# Patient Record
Sex: Female | Born: 1990
Health system: Southern US, Community
[De-identification: ages and names within clinical notes are randomized; demographics above are authoritative.]

## PROBLEM LIST (undated history)

## (undated) ENCOUNTER — Ambulatory Visit

## (undated) DIAGNOSIS — T7840XA Allergy, unspecified, initial encounter: Secondary | ICD-10-CM

## (undated) DIAGNOSIS — I10 Essential (primary) hypertension: Secondary | ICD-10-CM

## (undated) HISTORY — DX: Essential (primary) hypertension: I10

## (undated) HISTORY — DX: Allergy, unspecified, initial encounter: T78.40XA

---
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
## 2011-10-25 ENCOUNTER — Ambulatory Visit (INDEPENDENT_AMBULATORY_CARE_PROVIDER_SITE_OTHER): Payer: BC Managed Care – PPO

## 2011-10-25 DIAGNOSIS — R070 Pain in throat: Secondary | ICD-10-CM

## 2011-10-25 DIAGNOSIS — J069 Acute upper respiratory infection, unspecified: Secondary | ICD-10-CM

## 2012-08-25 ENCOUNTER — Ambulatory Visit (INDEPENDENT_AMBULATORY_CARE_PROVIDER_SITE_OTHER): Payer: BC Managed Care – PPO | Admitting: Family Medicine

## 2012-08-25 VITALS — BP 116/64 | HR 109 | Temp 98.1°F | Resp 16 | Ht 67.0 in | Wt 119.0 lb

## 2012-08-25 DIAGNOSIS — N898 Other specified noninflammatory disorders of vagina: Secondary | ICD-10-CM

## 2012-08-25 LAB — POCT WET PREP WITH KOH
KOH Prep POC: NEGATIVE
RBC Wet Prep HPF POC: NEGATIVE
Trichomonas, UA: NEGATIVE
Yeast Wet Prep HPF POC: NEGATIVE

## 2012-08-25 MED ORDER — FLUCONAZOLE 150 MG PO TABS: 150.0000 mg | ORAL_TABLET | Freq: Once | ORAL | Status: DC

## 2012-08-25 MED ORDER — METRONIDAZOLE 500 MG PO TABS: 500.0000 mg | ORAL_TABLET | Freq: Two times a day (BID) | ORAL | Status: DC

## 2012-08-25 NOTE — Progress Notes (Signed)
[facility] [address] [place] [address] [phone]  Date:  08/25/2012   Name:  Hannah Weber   DOB:  January 10, 1991   MRN:  [id number]  PCP:  Sheila Oats, MD    Chief Complaint: Vaginal Discharge   History of Present Illness:  Hannah Weber is a 21 y.o. very pleasant female patient who presents with the following:  She is here today to evaluate vaginal discharge- she notes a thin, white discharge with itching.  No recent abx, no new sexual partners.  She would like to be tested for yeast/ BV today Her LMP was about 3 weeks ago.   No urinary symptoms. No abdominal pain or nausea/ vomiting/ diarrhea  There is no problem list on file for this patient.   History reviewed. No pertinent past medical history.  History reviewed. No pertinent past surgical history.  History  Substance Use Topics  . Smoking status: Current Every Day Smoker  . Smokeless tobacco: Not on file  . Alcohol Use: No    Family History  Problem Relation Age of Onset  . Diabetes Mother     No Known Allergies  Medication list has been reviewed and updated.  No current outpatient prescriptions on file prior to visit.    Review of Systems:  As per HPI- otherwise negative.   Physical Examination: Filed Vitals:   08/25/12 1012  BP: 116/64  Pulse: 109  Temp: 98.1 F (36.7 C)  Resp: 16   Filed Vitals:   08/25/12 1012  Height: 5\' 7"  (1.702 m)  Weight: 119 lb (53.978 kg)   Body mass index is 18.64 kg/(m^2). Ideal Body Weight: Weight in (lb) to have BMI = 25: 159.3   GEN: WDWN, NAD, Non-toxic, A & O x 3 HEENT: Atraumatic, Normocephalic. Neck supple. No masses, No LAD. Ears and Nose: No external deformity. CV: RRR, No M/G/R. No JVD. No thrill. No extra heart sounds. PULM: CTA B, no wheezes, crackles, rhonchi. No retractions. No resp. distress. No accessory muscle use. ABD: S, NT, ND, +BS. No rebound. No HSM. EXTR: No c/c/e NEURO Normal gait.  PSYCH:  Normally interactive. Conversant. Not depressed or anxious appearing.  Calm demeanor.  GU: small vaginal canal, not able to tolerate speculum exam.  Performed WP swab and gently performed BME.  No CMT or uterine/ adnexal tenderness or masses.  Vaginal canal shows slight inflammation- suspect yeast or BV  Results for orders placed in visit on 08/25/12  POCT WET PREP WITH KOH      Component Value Range   Trichomonas, UA Negative     Clue Cells Wet Prep HPF POC 2-4     Epithelial Wet Prep HPF POC 1-3     Yeast Wet Prep HPF POC negative     Bacteria Wet Prep HPF POC 2+     RBC Wet Prep HPF POC negative     WBC Wet Prep HPF POC 10-15     KOH Prep POC Negative       Assessment and Plan: 1. Vaginal discharge  POCT Wet Prep with KOH, metroNIDAZOLE (FLAGYL) 500 MG tablet, fluconazole (DIFLUCAN) 150 MG tablet   Treat as above for vaginitis. If not better will RTC or call- at that time may need a ureaprobe.  She will let me know sooner if worse  Molli Gethers, MD

## 2012-08-25 NOTE — Patient Instructions (Addendum)
Please be sure to call me if you are not better in the next week or so- Sooner if worse.

## 2012-08-28 ENCOUNTER — Ambulatory Visit (INDEPENDENT_AMBULATORY_CARE_PROVIDER_SITE_OTHER): Payer: BC Managed Care – PPO | Admitting: Family Medicine

## 2012-08-28 ENCOUNTER — Telehealth: Payer: Self-pay

## 2012-08-28 VITALS — BP 114/62 | HR 81 | Temp 98.3°F | Resp 18 | Ht 68.0 in | Wt 115.0 lb

## 2012-08-28 DIAGNOSIS — N898 Other specified noninflammatory disorders of vagina: Secondary | ICD-10-CM

## 2012-08-28 DIAGNOSIS — N760 Acute vaginitis: Secondary | ICD-10-CM

## 2012-08-28 DIAGNOSIS — B373 Candidiasis of vulva and vagina: Secondary | ICD-10-CM

## 2012-08-28 DIAGNOSIS — R300 Dysuria: Secondary | ICD-10-CM

## 2012-08-28 DIAGNOSIS — L309 Dermatitis, unspecified: Secondary | ICD-10-CM

## 2012-08-28 LAB — POCT WET PREP WITH KOH
KOH Prep POC: POSITIVE
Trichomonas, UA: NEGATIVE
Yeast Wet Prep HPF POC: POSITIVE

## 2012-08-28 LAB — POCT URINALYSIS DIPSTICK
Glucose, UA: NEGATIVE
Nitrite, UA: NEGATIVE
Protein, UA: 30
Urobilinogen, UA: 0.2

## 2012-08-28 LAB — POCT UA - MICROSCOPIC ONLY: Crystals, Ur, HPF, POC: NEGATIVE

## 2012-08-28 MED ORDER — TRIAMCINOLONE ACETONIDE 0.1 % EX CREA: TOPICAL_CREAM | Freq: Two times a day (BID) | CUTANEOUS | Status: DC

## 2012-08-28 MED ORDER — METRONIDAZOLE 0.75 % VA GEL: 1.0000 | Freq: Every day | VAGINAL | Status: AC

## 2012-08-28 MED ORDER — FLUCONAZOLE 150 MG PO TABS: 150.0000 mg | ORAL_TABLET | Freq: Once | ORAL | Status: DC

## 2012-08-28 MED ORDER — SULFAMETHOXAZOLE-TRIMETHOPRIM 800-160 MG PO TABS: 1.0000 | ORAL_TABLET | Freq: Two times a day (BID) | ORAL | Status: DC

## 2012-08-28 NOTE — Patient Instructions (Addendum)
Start the twice a day bactrim antibiotic for the hair and pore infection and to cover any potential urine infection.  Use the metronidazole gel every night - 1 applicatorful into the vagina for 5 night.  Take a yeast pill (diflucan) tonight, repeat in 1 wk and repeat 3d after that. If you are still having symptoms in 2 wks, come back to see me.  If you get worse, come back sooner.

## 2012-08-28 NOTE — Progress Notes (Signed)
Subjective:    Patient ID: Hannah Weber, female    DOB: Dec 27, 1990, 21 y.o.   MRN: [id number] Chief Complaint  Patient presents with  . Vaginal Discharge    no improvement still itching  . Other    urine discoloration   . Hemorrhoids    noticed cut     HPI  Urine dark x 3d.  Has hesitancy and frequency and urgency. Has had the vaginal discharge for about a wk and loosk like white cream.  Did try otc monistat once sev days before she was seen here. Her wet prep/KOH was negative but due to her sxs she was treated emperically with diflucan and flagyl which she is taking still.  Still having some burning around vaginal opening and razor bump problems - bumps on inner labia and mons.  She notices a lot of thick white discharge under clitoral hood. Also had a painful stool with a tiny bit of blood on the outside so would like to be checked for hemorrhoids.  Pt has is sexually active with women only - she does not practice penetration at all (or ever) so her introitus is very small and internal vaginal exams are exquisitely painful for her. No past medical history on file. No current outpatient prescriptions on file prior to visit.   Review of Systems  Constitutional: Negative for fever, chills, diaphoresis, activity change, appetite change, fatigue and unexpected weight change.  Gastrointestinal: Positive for blood in stool and rectal pain. Negative for abdominal pain, diarrhea, constipation and anal bleeding.  Genitourinary: Positive for urgency, frequency, hematuria, decreased urine volume, vaginal discharge, genital sores and vaginal pain. Negative for dysuria, flank pain, vaginal bleeding, enuresis, difficulty urinating, menstrual problem, pelvic pain and dyspareunia.  Musculoskeletal: Negative for gait problem.  Skin: Positive for rash.  Hematological: Negative for adenopathy.  Psychiatric/Behavioral: The patient is nervous/anxious.       BP 114/62  Pulse 81  Temp 98.3 F (36.8 C)  (Oral)  Resp 18  Ht 5\' 8"  (1.727 m)  Wt 115 lb (52.164 kg)  BMI 17.49 kg/m2  SpO2 99%  LMP 07/28/2012 Objective:   Physical Exam  Constitutional: She is oriented to person, place, and time. She appears well-developed and well-nourished. No distress.  HENT:  Head: Normocephalic and atraumatic.  Pulmonary/Chest: Effort normal.  Genitourinary: Rectal exam shows external hemorrhoid. Rectal exam shows no internal hemorrhoid, no fissure, no mass, no tenderness and anal tone normal. Pelvic exam was performed with patient supine. There is rash and tenderness on the right labia. There is rash and tenderness on the left labia. Cervix exhibits no motion tenderness. There is tenderness around the vagina. No erythema or bleeding around the vagina. No foreign body around the vagina. No signs of injury around the vagina. Vaginal discharge found.       Did not do speculum exam, bimanual exam with 1 finger done only and still very painful for pt.  In pubic hair and external labia has small skin colored papules w/ white scale/crust Thin white discharge seen. 1 non-inflammed soft external hemorrhoid at 12 o'clock  Lymphadenopathy:       Right: No inguinal adenopathy present.       Left: No inguinal adenopathy present.  Neurological: She is alert and oriented to person, place, and time.  Skin: Skin is warm and dry. Rash noted. Rash is maculopapular. She is not diaphoretic.          Dry scaling eczematous lesion  Psychiatric: She has a normal  mood and affect. Her behavior is normal.      Results for orders placed in visit on 08/28/12  POCT WET PREP WITH KOH      Component Value Range   Trichomonas, UA Negative     Clue Cells Wet Prep HPF POC 3-6     Epithelial Wet Prep HPF POC 8-16     Yeast Wet Prep HPF POC positive     Bacteria Wet Prep HPF POC 3+     RBC Wet Prep HPF POC 1-4     WBC Wet Prep HPF POC 3-8     KOH Prep POC Positive    POCT UA - MICROSCOPIC ONLY      Component Value Range   WBC,  Ur, HPF, POC 6-8     RBC, urine, microscopic 2-5     Bacteria, U Microscopic 3+     Mucus, UA trace     Epithelial cells, urine per micros 2-10     Crystals, Ur, HPF, POC neg     Casts, Ur, LPF, POC neg     Yeast, UA positive    POCT URINALYSIS DIPSTICK      Component Value Range   Color, UA brown     Clarity, UA cloudy     Glucose, UA neg     Bilirubin, UA small     Ketones, UA trace     Spec Grav, UA 1.020     Blood, UA neg     pH, UA 7.5     Protein, UA 30     Urobilinogen, UA 0.2     Nitrite, UA neg     Leukocytes, UA small (1+)      Assessment & Plan:   1. Dysuria  POCT Wet Prep with KOH, POCT UA - Microscopic Only, POCT urinalysis dipstick, Urine culture, GC/chlamydia probe amp, genital, sulfamethoxazole-trimethoprim (BACTRIM DS,SEPTRA DS) 800-160 MG per tablet  2. Vaginitis  POCT Wet Prep with KOH, POCT UA - Microscopic Only, POCT urinalysis dipstick, Urine culture, GC/chlamydia probe amp, genital, metroNIDAZOLE (METROGEL VAGINAL) 0.75 % vaginal gel  3. Vaginal candidiasis  fluconazole (DIFLUCAN) 150 MG tablet  4. Vaginal discharge  fluconazole (DIFLUCAN) 150 MG tablet  5. Eczema - start triamcinolone  Pt has yeast infection and folliculitis.  It seems like her initial sxs were from the yeast infection so I suspect that has been her underlying concern. Her urine dip looks suprisingly good for brown cloudy urine with her sxs.  Will treat her folliculitis w/ Bactrim in hopes that that will cover any potential UTI - though UClx pending as well.  No BV seen on wet prep but as pt as started the flagyl will have her finish course - however, she is having a lot of trouble getting the large pills down so will switch her to metro-gel vag.   Take diflucan pill after she has finished all antibiotics and repeat again 3d later. RTC in 2 wks if sxs persist. Sooner if worsening.

## 2012-08-28 NOTE — Telephone Encounter (Signed)
She is coming back to office, she now has urinary symptoms.

## 2012-08-28 NOTE — Telephone Encounter (Signed)
PT STATES SHE WAS GIVEN SOME MEDICINE AND NOW HER URINE HAVE CHANGED COLORS AND SHE IS CONCERNED. WOULD LIKE TO KNOW IF SHE SHOULD BE PLEASE CALL [phone]

## 2012-08-30 ENCOUNTER — Telehealth: Payer: Self-pay

## 2012-08-30 LAB — URINE CULTURE: Colony Count: 40000

## 2012-08-30 LAB — GC/CHLAMYDIA PROBE AMP: GC Probe RNA: NEGATIVE

## 2012-08-30 NOTE — Telephone Encounter (Signed)
LMOM to CB. 

## 2012-08-30 NOTE — Telephone Encounter (Signed)
Pt is needing to talk with someone she feels her medications is not right   Best number [phone]

## 2012-08-31 NOTE — Telephone Encounter (Signed)
lmom to cb. 

## 2012-09-01 ENCOUNTER — Telehealth: Payer: Self-pay

## 2012-09-01 NOTE — Telephone Encounter (Signed)
lmom to cb. 

## 2012-09-01 NOTE — Telephone Encounter (Signed)
Patient was prescribed bactrim and would like something different prescribed instead.  Best [phone]  [facility]

## 2012-09-01 NOTE — Telephone Encounter (Signed)
Pt stated that it was a misunderstanding about medication and she was ok

## 2012-09-02 NOTE — Telephone Encounter (Signed)
lmom to cb  Get more info on why she wants to change.

## 2012-09-02 NOTE — Telephone Encounter (Signed)
If pt is not seeing improvement, she should RTC

## 2012-09-02 NOTE — Telephone Encounter (Signed)
Patient states she is still having trouble with folliculitis, she states UTI is better, would like to know if she should try something else for the folliculitis. Please advise.

## 2012-09-02 NOTE — Telephone Encounter (Signed)
Left message for her to advise. She is to call back if she has questions.

## 2012-09-03 ENCOUNTER — Ambulatory Visit (INDEPENDENT_AMBULATORY_CARE_PROVIDER_SITE_OTHER): Payer: BC Managed Care – PPO | Admitting: Family Medicine

## 2012-09-03 VITALS — BP 120/82 | HR 92 | Temp 98.7°F | Resp 16 | Ht 68.5 in | Wt 115.6 lb

## 2012-09-03 DIAGNOSIS — R21 Rash and other nonspecific skin eruption: Secondary | ICD-10-CM

## 2012-09-03 MED ORDER — DOXYCYCLINE HYCLATE 100 MG PO TABS: 100.0000 mg | ORAL_TABLET | Freq: Two times a day (BID) | ORAL | Status: DC

## 2012-09-03 MED ORDER — MUPIROCIN CALCIUM 2 % EX CREA: TOPICAL_CREAM | Freq: Three times a day (TID) | CUTANEOUS | Status: DC

## 2012-09-03 NOTE — Progress Notes (Signed)
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 The urinary symptoms of cleared as has a yeast infection. Nevertheless the vulva continues be red scaly and irritated (having discomfort when wearing underwear). She's given triamcinolone along with the Septra and Diflucan on 11/27. Patient only has same sex partners  Objective: Patient has a thickened, scaly skin rash on the vulva and lower pubic area. There no open sores, no papules and no lesions suggestive of HPV.  Assessment: Mild cellulitis  Plan: Doxycycline and Bactroban 1. Rash  doxycycline (VIBRA-TABS) 100 MG tablet, mupirocin cream (BACTROBAN) 2 %

## 2014-11-26 ENCOUNTER — Ambulatory Visit (INDEPENDENT_AMBULATORY_CARE_PROVIDER_SITE_OTHER): Payer: BC Managed Care – PPO | Admitting: Physician Assistant

## 2014-11-26 VITALS — BP 142/80 | HR 106 | Temp 97.9°F | Resp 20 | Ht 67.25 in | Wt 133.2 lb

## 2014-11-26 DIAGNOSIS — L02214 Cutaneous abscess of groin: Secondary | ICD-10-CM

## 2014-11-26 MED ORDER — DOXYCYCLINE HYCLATE 100 MG PO CAPS: 100.0000 mg | ORAL_CAPSULE | Freq: Two times a day (BID) | ORAL | Status: DC

## 2014-11-26 MED ORDER — IBUPROFEN 600 MG PO TABS: 600.0000 mg | ORAL_TABLET | Freq: Three times a day (TID) | ORAL | Status: DC | PRN

## 2014-11-26 NOTE — Patient Instructions (Signed)
Incision and Drainage Care After Refer to this sheet in the next few weeks. These instructions provide you with information on caring for yourself after your procedure. Your caregiver may also give you more specific instructions. Your treatment has been planned according to current medical practices, but problems sometimes occur. Call your caregiver if you have any problems or questions after your procedure. HOME CARE INSTRUCTIONS   If antibiotic medicine is given, take it as directed. Finish it even if you start to feel better.  Only take over-the-counter or prescription medicines for pain, discomfort, or fever as directed by your caregiver.  Keep all follow-up appointments as directed by your caregiver.  Change any bandages (dressings) as directed by your caregiver. Replace old dressings with clean dressings.  Wash your hands before and after caring for your wound. You will receive specific instructions for cleansing and caring for your wound.  SEEK MEDICAL CARE IF:   You have increased pain, swelling, or redness around the wound.  You have increased drainage, smell, or bleeding from the wound.  You have muscle aches, chills, or you feel generally sick.  You have a fever. MAKE SURE YOU:   Understand these instructions.  Will watch your condition.  Will get help right away if you are not doing well or get worse. Document Released: 12/11/2011 Document Reviewed: 12/11/2011 ExitCare Patient Information 2015 [employer]. This information is not intended to replace advice given to you by your health care provider. Make sure you discuss any questions you have with your health care provider.  

## 2014-11-26 NOTE — Progress Notes (Signed)
   Subjective:    Patient ID: Hannah Weber, female    DOB: April 20, 1991, 24 y.o.   MRN: [id number]  HPI Patient presents for boil that has been on right leg 5 days. Initially looked like an ingrown hair, but grew in size and became more painful over past 4 days. Is red and swollen. Has never had this before. Denies fever. NKDA.    Review of Systems  Constitutional: Negative for fever and chills.  Skin: Negative for pallor, rash and wound.  Hematological: Negative for adenopathy.       Objective:   Physical Exam  Constitutional: She is oriented to person, place, and time. She appears well-developed and well-nourished. No distress.  Blood pressure 142/80, pulse 106, temperature 97.9 F (36.6 C), temperature source Oral, resp. rate 20, height 5' 7.25" (1.708 m), weight 133 lb 4 oz (60.442 kg), last menstrual period 11/02/2014, SpO2 100 %.  HENT:  Head: Normocephalic and atraumatic.  Right Ear: External ear normal.  Left Ear: External ear normal.  Neurological: She is alert and oriented to person, place, and time.  Skin: Skin is warm and dry. No rash noted. She is not diaphoretic. There is erythema. No pallor.  3-31/2 cm abscess that is erythematous located in right groin.   Procedure Consent obtained. Local anesthesia 2 cc 1% lido. 1 1/2 cm incision made. Culture obtained. Purulence expressed. Wound explored and sebaceous material removed. Irrigated with 3 cc lido. 1/4 plain packing placed. Clean dressing placed.      Assessment & Plan:  1. Abscess of groin, right Wound care instructions given. RTC 11/28/14 for wound check.  - doxycycline (VIBRAMYCIN) 100 MG capsule; Take 1 capsule (100 mg total) by mouth 2 (two) times daily.  Dispense: 20 capsule; Refill: 0 - ibuprofen (ADVIL,MOTRIN) 600 MG tablet; Take 1 tablet (600 mg total) by mouth every 8 (eight) hours as needed.  Dispense: 30 tablet; Refill: 0 - Wound culture   Alveta Heimlich PA-C  [facility] 11/26/2014 7:53 PM

## 2014-11-28 ENCOUNTER — Ambulatory Visit (INDEPENDENT_AMBULATORY_CARE_PROVIDER_SITE_OTHER): Payer: BC Managed Care – PPO | Admitting: Family Medicine

## 2014-11-28 VITALS — BP 124/66 | HR 88 | Temp 98.2°F | Resp 18 | Ht 68.0 in | Wt 136.0 lb

## 2014-11-28 DIAGNOSIS — L02214 Cutaneous abscess of groin: Secondary | ICD-10-CM

## 2014-11-28 NOTE — Progress Notes (Signed)
° °  Subjective:    Patient ID: Hannah Weber, female    DOB: [birth year], 24 y.o.   MRN: [id number] This chart was scribed for Robyn Haber, MD by Zola Button, Medical Scribe. This patient was seen in Room 10 and the patient's care was started at 8:58 AM.   HPI HPI Comments: Hannah Weber is a 24 y.o. female who presents to the [facility] for a wound check. Patient has a boil on her groin on the right side that was drained 2 days here by [employer], PA-C. She states that she had had the boil for 5 days before it was lanced. She has been taking the antibiotics as prescribed. Patient denies fever.    Review of Systems  Constitutional: Negative for fever.  Skin: Positive for wound.       Objective:   Physical Exam CONSTITUTIONAL: Well developed/well nourished HEAD: Normocephalic/atraumatic EYES: EOM/PERRL ENMT: Mucous membranes moist NECK: supple no meningeal signs SPINE: entire spine nontender CV: S1/S2 noted, no murmurs/rubs/gallops noted LUNGS: Lungs are clear to auscultation bilaterally, no apparent distress ABDOMEN: soft, nontender, no rebound or guarding GU: no cva tenderness NEURO: Pt is awake/alert, moves all extremitiesx4 EXTREMITIES: pulses normal, full ROM SKIN: warm, color normal PSYCH: no abnormalities of mood noted  Right groin abscess shows small amount of sebaceous material at the base which was expressed after the surgical gauze was removed. There is no significant induration or erythema in the surrounding area.      Assessment & Plan:  Sebaceous abscess, healing  This chart was scribed in my presence and reviewed by me personally.    ICD-9-CM ICD-10-CM   1. Abscess of groin, right 682.2 O96.295      Signed, Robyn Haber, MD

## 2014-11-29 LAB — WOUND CULTURE

## 2014-11-30 ENCOUNTER — Telehealth: Payer: Self-pay | Admitting: Physician Assistant

## 2014-11-30 NOTE — Telephone Encounter (Signed)
Spoke with patient about lab results. States that she was here on Saturday and wound was not repacked and she is a little concerned as it is feeling hard in one area. Advised to come back in and will re-evaluate.

## 2015-02-04 ENCOUNTER — Telehealth: Payer: Self-pay

## 2015-02-04 ENCOUNTER — Other Ambulatory Visit: Payer: Self-pay | Admitting: Radiology

## 2015-02-04 NOTE — Telephone Encounter (Signed)
Patient is returning missed phone call. Please call back!

## 2015-02-04 NOTE — Telephone Encounter (Signed)
Pt was seen on 2/25 for an abscess on her groin & then she had a follow up on 2/27 for the same thing. She is wanting a note for work-days she wants it for is unclear. Please advise at [phone]

## 2015-02-04 NOTE — Telephone Encounter (Signed)
Pt hasn't been seen since 2/27. Surely we can't write work note for 5/3? I called pt to clarify more but had to leave message.

## 2015-02-04 NOTE — Telephone Encounter (Signed)
Pt wants a work note for 5/3.

## 2015-02-04 NOTE — Telephone Encounter (Signed)
Will write note

## 2015-02-04 NOTE — Telephone Encounter (Signed)
Hannah Weber for work note for 5/03.

## 2015-02-04 NOTE — Telephone Encounter (Signed)
[name] needs a note for work for the dates of 11/26/14 & 11/28/14. She missed work on those two days because of being ill she was seen here. Apparently a final was given to her on her job because she didn't have a doctor's note for those two days and she's needing proof now that she was here and did miss work on those two days. She was just approached about it from her job on 02/02/15. Please contact and let her know if she can get a note or not.

## 2015-02-04 NOTE — Telephone Encounter (Signed)
Note written for 2/25 and 2/27. Pt notified and will come pick up note.

## 2015-09-05 ENCOUNTER — Ambulatory Visit (INDEPENDENT_AMBULATORY_CARE_PROVIDER_SITE_OTHER): Payer: BC Managed Care – PPO | Admitting: Physician Assistant

## 2015-09-05 VITALS — BP 120/76 | HR 82 | Temp 98.6°F | Resp 18 | Ht 68.0 in | Wt 144.2 lb

## 2015-09-05 DIAGNOSIS — K611 Rectal abscess: Secondary | ICD-10-CM | POA: Diagnosis not present

## 2015-09-05 DIAGNOSIS — F17210 Nicotine dependence, cigarettes, uncomplicated: Secondary | ICD-10-CM | POA: Diagnosis not present

## 2015-09-05 DIAGNOSIS — F17200 Nicotine dependence, unspecified, uncomplicated: Secondary | ICD-10-CM

## 2015-09-05 MED ORDER — AMOXICILLIN-POT CLAVULANATE 875-125 MG PO TABS: 1.0000 | ORAL_TABLET | Freq: Two times a day (BID) | ORAL | Status: DC

## 2015-09-06 NOTE — Progress Notes (Signed)
   09/07/2015 8:55 AM   DOB: 01/30/1991 / MRN: [id number]  SUBJECTIVE:  Hannah Weber is a 24 y.o. female presenting for 3 days of right sided perirectal pain made worse with sitting. Describes the pain as mild to moderate a throbbing in nature.  No fever, chills, nausea, diarrhea or constipation.  She has tried Ibuprofen OTC with good relief.  Denies abnormal vaginal discharge.    She has No Known Allergies.   She  has no past medical history on file.    She  reports that she has been smoking.  She has never used smokeless tobacco. She reports that she drinks alcohol. She reports that she uses illicit drugs (Marijuana). She  reports that she does not engage in sexual activity. The patient  has no past surgical history on file.  Her family history includes Diabetes in her mother.  Review of Systems  Musculoskeletal: Negative for myalgias.  Skin: Positive for rash. Negative for itching.  Neurological: Negative for dizziness.    Problem list and medications reviewed and updated by myself where necessary, and exist elsewhere in the encounter.   OBJECTIVE:  BP 120/76 mmHg  Pulse 82  Temp(Src) 98.6 F (37 C) (Oral)  Resp 18  Ht 5\' 8"  (1.727 m)  Wt 144 lb 3.2 oz (65.409 kg)  BMI 21.93 kg/m2  SpO2 97%  LMP 09/02/2015 CrCl cannot be calculated (Patient has no serum creatinine result on file.).  Physical Exam  Constitutional: She is oriented to person, place, and time. She appears well-nourished. No distress.  Eyes: EOM are normal. Pupils are equal, round, and reactive to light.  Cardiovascular: Normal rate.   Pulmonary/Chest: Effort normal.  Abdominal: She exhibits no distension.  Neurological: She is alert and oriented to person, place, and time. No cranial nerve deficit. Gait normal.  Skin: Skin is dry. She is not diaphoretic.     Psychiatric: She has a normal mood and affect.  Vitals reviewed.   No results found for this or any previous visit (from the past 48  hour(s)).  ASSESSMENT AND PLAN  [name] was seen today for recurrent skin infections.  Diagnoses and all orders for this visit:  Perirectal abscess: vs. Abscess.  Will cover for perirectal and doubt staph.  Advised that she return if her symptoms worsen.   -     amoxicillin-clavulanate (AUGMENTIN) 875-125 MG tablet; Take 1 tablet by mouth 2 (two) times daily.    The patient was advised to call or return to clinic if she does not see an improvement in symptoms or to seek the care of the closest emergency department if she worsens with the above plan.   Philis Fendt, MHS, PA-C [facility] 09/07/2015 8:55 AM

## 2015-09-07 DIAGNOSIS — F17200 Nicotine dependence, unspecified, uncomplicated: Secondary | ICD-10-CM | POA: Insufficient documentation

## 2015-09-07 HISTORY — DX: Nicotine dependence, unspecified, uncomplicated: F17.200

## 2015-09-08 NOTE — Progress Notes (Signed)
  Medical screening examination/treatment/procedure(s) were performed by non-physician practitioner and as supervising physician I was immediately available for consultation/collaboration.     

## 2015-10-22 ENCOUNTER — Encounter: Payer: Self-pay | Admitting: Family Medicine

## 2015-10-27 ENCOUNTER — Encounter: Payer: Self-pay | Admitting: Family Medicine

## 2017-10-07 ENCOUNTER — Ambulatory Visit (HOSPITAL_COMMUNITY)
Admission: EM | Admit: 2017-10-07 | Discharge: 2017-10-07 | Disposition: A | Discharge status: Home / Self Care | Payer: Self-pay | Attending: Urgent Care | Admitting: Urgent Care

## 2017-10-07 ENCOUNTER — Encounter (HOSPITAL_COMMUNITY): Payer: Self-pay | Admitting: Emergency Medicine

## 2017-10-07 DIAGNOSIS — Z789 Other specified health status: Secondary | ICD-10-CM

## 2017-10-07 DIAGNOSIS — R1013 Epigastric pain: Secondary | ICD-10-CM

## 2017-10-07 DIAGNOSIS — Z7289 Other problems related to lifestyle: Secondary | ICD-10-CM

## 2017-10-07 DIAGNOSIS — R110 Nausea: Secondary | ICD-10-CM

## 2017-10-07 DIAGNOSIS — R5381 Other malaise: Secondary | ICD-10-CM

## 2017-10-07 DIAGNOSIS — R195 Other fecal abnormalities: Secondary | ICD-10-CM

## 2017-10-07 DIAGNOSIS — R112 Nausea with vomiting, unspecified: Secondary | ICD-10-CM

## 2017-10-07 LAB — POCT URINALYSIS DIP (DEVICE)
BILIRUBIN URINE: NEGATIVE
Glucose, UA: NEGATIVE mg/dL
KETONES UR: 80 mg/dL — AB
Leukocytes, UA: NEGATIVE
Nitrite: NEGATIVE
PH: 5.5 (ref 5.0–8.0)
PROTEIN: NEGATIVE mg/dL
Specific Gravity, Urine: 1.015 (ref 1.005–1.030)
Urobilinogen, UA: 0.2 mg/dL (ref 0.0–1.0)

## 2017-10-07 LAB — POCT I-STAT, CHEM 8
BUN: 10 mg/dL (ref 6–20)
CHLORIDE: 103 mmol/L (ref 101–111)
CREATININE: 0.8 mg/dL (ref 0.44–1.00)
Calcium, Ion: 1.2 mmol/L (ref 1.15–1.40)
GLUCOSE: 109 mg/dL — AB (ref 65–99)
HCT: 48 % — ABNORMAL HIGH (ref 36.0–46.0)
Hemoglobin: 16.3 g/dL — ABNORMAL HIGH (ref 12.0–15.0)
POTASSIUM: 4.1 mmol/L (ref 3.5–5.1)
Sodium: 140 mmol/L (ref 135–145)
TCO2: 24 mmol/L (ref 22–32)

## 2017-10-07 LAB — POCT H PYLORI SCREEN: H. PYLORI SCREEN, POC: NEGATIVE

## 2017-10-07 MED ORDER — ONDANSETRON 8 MG PO TBDP: 8.0000 mg | ORAL_TABLET | Freq: Three times a day (TID) | ORAL | 0 refills | Status: DC | PRN

## 2017-10-07 NOTE — ED Provider Notes (Signed)
MRN: [id number] DOB: 1991-07-13  Subjective:   Hannah Weber is a 27 y.o. female presenting for 3 day history of epigastric pain, nausea without vomiting, ~2 loose stools per day. Denies fever, dysuria, hematuria, blood stools, polydipsia. She is trying to eat but her belly starts hurting with food. She started her cycle 09/26/2017, stopped 10/03/2017 and was regular. Patient stopped drinking new year's day. Prior to that patient admits she was drinking at least 4 beers per day regularly. Has tried Pepto-Bismol last night without any significant changes. Admits that she has not been taking very good care of herself, she is not hydrating well. Has never had H. pylori infection.   [name] is not currently taking any medications and has No Known Allergies.  Earlean denies past medical and surgical history. Her family history includes Diabetes in her mother.   Objective:   Vitals: BP 134/73 (BP Location: Left Arm)   Pulse (!) 116   Temp 99 F (37.2 C) (Oral)   Resp 18   LMP 09/27/2017   SpO2 100%   Physical Exam  Constitutional: She is oriented to person, place, and time. She appears well-developed and well-nourished.  HENT:  Mouth/Throat: Oropharynx is clear and moist.  Cardiovascular: Normal rate, regular rhythm and intact distal pulses. Exam reveals no gallop and no friction rub.  No murmur heard. Pulmonary/Chest: No respiratory distress. She has no wheezes. She has no rales.  Abdominal: Soft. Bowel sounds are normal. She exhibits no distension and no mass. There is no tenderness. There is no guarding.  Neurological: She is alert and oriented to person, place, and time.  Skin: Skin is warm and dry.  Psychiatric: She has a normal mood and affect.   Results for orders placed or performed during the hospital encounter of 10/07/17 (from the past 24 hour(s))  POCT urinalysis dip (device)     Status: Abnormal   Collection Time: 10/07/17  8:27 PM  Result Value Ref Range   Glucose, UA  NEGATIVE NEGATIVE mg/dL   Bilirubin Urine NEGATIVE NEGATIVE   Ketones, ur 80 (A) NEGATIVE mg/dL   Specific Gravity, Urine 1.015 1.005 - 1.030   Hgb urine dipstick SMALL (A) NEGATIVE   pH 5.5 5.0 - 8.0   Protein, ur NEGATIVE NEGATIVE mg/dL   Urobilinogen, UA 0.2 0.0 - 1.0 mg/dL   Nitrite NEGATIVE NEGATIVE   Leukocytes, UA NEGATIVE NEGATIVE  H.pylori screen, POC     Status: None   Collection Time: 10/07/17  8:47 PM  Result Value Ref Range   H. PYLORI SCREEN, POC NEGATIVE NEGATIVE  I-STAT, chem 8     Status: Abnormal   Collection Time: 10/07/17  8:49 PM  Result Value Ref Range   Sodium 140 135 - 145 mmol/L   Potassium 4.1 3.5 - 5.1 mmol/L   Chloride 103 101 - 111 mmol/L   BUN 10 6 - 20 mg/dL   Creatinine, Ser 0.80 0.44 - 1.00 mg/dL   Glucose, Bld 109 (H) 65 - 99 mg/dL   Calcium, Ion 1.20 1.15 - 1.40 mmol/L   TCO2 24 22 - 32 mmol/L   Hemoglobin 16.3 (H) 12.0 - 15.0 g/dL   HCT 48.0 (H) 36.0 - 46.0 %   Assessment and Plan :   Abdominal pain, epigastric  Alcohol use  Nausea without vomiting  Loose stools  Malaise   Patient is to try supportive care, maintain no alcohol use. Recommended she follow up with her PCP for repeat cbc and further work up. Return-to-clinic precautions  discussed, patient verbalized understanding.    Jaynee Eagles, [place] 10/07/17 2112

## 2017-10-07 NOTE — Discharge Instructions (Addendum)
Please try to stay away from alcohol use and hydrate very well.

## 2017-10-07 NOTE — ED Triage Notes (Signed)
PT C/O: intermittent abd pain onset 3 days associated w/nauseas, diarrhea, deceased appetite  Hx of GERD  DENIES: vomiting, diarrhea, urinary sx  TAKING MEDS: Pepto bismol w/some relief  A&O x4... NAD... Ambulatory

## 2017-10-11 ENCOUNTER — Encounter (HOSPITAL_COMMUNITY): Payer: Self-pay

## 2017-10-11 DIAGNOSIS — F17200 Nicotine dependence, unspecified, uncomplicated: Secondary | ICD-10-CM | POA: Insufficient documentation

## 2017-10-11 DIAGNOSIS — F419 Anxiety disorder, unspecified: Secondary | ICD-10-CM | POA: Insufficient documentation

## 2017-10-11 NOTE — ED Triage Notes (Signed)
Pt smoked some marijuana this evening and then had a panic attack, EMS gave her a breathing treatment and she calmed down and her heartrate went from 160 to 104

## 2017-10-12 ENCOUNTER — Emergency Department (HOSPITAL_COMMUNITY)
Admission: EM | Admit: 2017-10-12 | Discharge: 2017-10-12 | Disposition: A | Discharge status: Home / Self Care | Payer: Self-pay | Attending: Emergency Medicine | Admitting: Emergency Medicine

## 2017-10-12 ENCOUNTER — Telehealth: Payer: Self-pay | Admitting: Physician Assistant

## 2017-10-12 DIAGNOSIS — F410 Panic disorder [episodic paroxysmal anxiety] without agoraphobia: Secondary | ICD-10-CM

## 2017-10-12 MED ORDER — HYDROXYZINE HCL 25 MG PO TABS
25.0000 mg | ORAL_TABLET | Freq: Once | ORAL | Status: AC
  Administered 2017-10-12: 25 mg via ORAL
  Filled 2017-10-12: qty 1

## 2017-10-12 MED ORDER — HYDROXYZINE HCL 25 MG PO TABS: 25.0000 mg | ORAL_TABLET | Freq: Four times a day (QID) | ORAL | 0 refills | Status: DC | PRN

## 2017-10-12 NOTE — ED Provider Notes (Signed)
[facility] DEPT Provider Note   CSN: [id number] Arrival date & time: 10/11/17  2226     History   Chief Complaint Chief Complaint  Patient presents with  . Anxiety    HPI Hannah Weber is a 27 y.o. female.  The history is provided by the patient and medical records.  Anxiety     27 year old female presenting to the ED with anxiety.  Patient states yesterday evening she smoked some leftover "roaches" from prior blunts that she smoked a few days prior.  States she smoked 1 and then went home and smoked a cigarette but began feeling palpitations and very nervous/anxious.  States she called EMS and they gave her breathing treatments which seemed to help somewhat.  States while in the waiting room she was feeling fine but after being placed into exam room she began feeling anxious again.  After talking with patient for a while, she discloses that she has recently gone through some life changes.  Specifically she stopped drinking at the end of the year.  She has not had any tremors or seizure activity.  States she did have a little bit of abdominal pain earlier in the week and was seen at urgent care and had some lab tests done that were normal.  She tells me that they felt like she had gastritis.  She does report her pain was worse when eating spicy/acidic foods.  She denies any current abdominal pain.  States she has had a little bit of trouble adjusting to her daily routine without involvement of alcohol but she is motivated to stay sober at this time.  States she has had some racing thoughts, mostly in relations to her health and making sure she has not done any detrimental damage to her body.  She denies any suicidal or homicidal ideation.  No hallucinations.  History reviewed. No pertinent past medical history.  Patient Active Problem List   Diagnosis Date Noted  . Smoker unmotivated to quit 09/07/2015    History reviewed. No pertinent surgical  history.  OB History    No data available       Home Medications    Prior to Admission medications   Medication Sig Start Date End Date Taking? Authorizing Provider  amoxicillin-clavulanate (AUGMENTIN) 875-125 MG tablet Take 1 tablet by mouth 2 (two) times daily. 09/05/15   Tereasa Coop, PA-C  ondansetron (ZOFRAN-ODT) 8 MG disintegrating tablet Take 1 tablet (8 mg total) by mouth every 8 (eight) hours as needed for nausea. 10/07/17   Jaynee Eagles, PA-C    Family History Family History  Problem Relation Age of Onset  . Diabetes Mother     Social History Social History   Tobacco Use  . Smoking status: Current Every Day Smoker  . Smokeless tobacco: Never Used  Substance Use Topics  . Alcohol use: Yes    Alcohol/week: 0.0 oz  . Drug use: Yes    Types: Marijuana     Allergies   Patient has no known allergies.   Review of Systems Review of Systems  Psychiatric/Behavioral: The patient is nervous/anxious.   All other systems reviewed and are negative.    Physical Exam Updated Vital Signs BP (!) 144/96 (BP Location: Right Arm)   Pulse 91   Temp 98.7 F (37.1 C) (Oral)   Resp 16   Ht 5\' 7"  (1.702 m)   Wt 75 kg (165 lb 4.8 oz)   LMP 09/27/2017   SpO2 100%  BMI 25.89 kg/m   Physical Exam  Constitutional: She is oriented to person, place, and time. She appears well-developed and well-nourished.  HENT:  Head: Normocephalic and atraumatic.  Mouth/Throat: Oropharynx is clear and moist.  Eyes: Conjunctivae and EOM are normal. Pupils are equal, round, and reactive to light. No scleral icterus.  Neck: Normal range of motion.  Cardiovascular: Normal rate, regular rhythm and normal heart sounds.  Pulmonary/Chest: Effort normal and breath sounds normal. No stridor. No respiratory distress.  Lungs clear, no distress  Abdominal: Soft. Bowel sounds are normal. There is no tenderness. There is no rebound.  Musculoskeletal: Normal range of motion.  Neurological: She is  alert and oriented to person, place, and time.  Skin: Skin is warm and dry.  Psychiatric: Her mood appears anxious.  Appears a little anxious but after talking with her she calms easily No SI/HI/AVH  Nursing note and vitals reviewed.    ED Treatments / Results  Labs (all labs ordered are listed, but only abnormal results are displayed) Labs Reviewed - No data to display  EKG  EKG Interpretation None       Radiology No results found.  Procedures Procedures (including critical care time)  Medications Ordered in ED Medications  hydrOXYzine (ATARAX/VISTARIL) tablet 25 mg (not administered)     Initial Impression / Assessment and Plan / ED Course  I have reviewed the triage vital signs and the nursing notes.  Pertinent labs & imaging results that were available during my care of the patient were reviewed by me and considered in my medical decision making (see chart for details).  27 year old female here after what seems to be an anxiety attack.  After talking with her for a while in the room, it does seem she has been going through some life changes-- mostly that she recently stopped drinking and is trying to improve her overall health (better diet, etc).  No tremors or seizure activity.  States she has been worried about potential "damage" she has done to her body from drinking.  She denies any current abdominal pain.  No chest pain or shortness of breath at present.  She has no signs of jaundice or scleral icterus.  She is a little anxious but calmed easily.  States she does worry a lot and feels like she would be better off if she could just "ease her mind a little".  I talked with her for a long while about using medications temporarily until she gets some of the kinks worked out with her new lifestyle change-- she is open to this.  Will have her vistaril to avoid potential sedating/addictive potential.  She has previously scheduled follow-up with her doctor tomorrow for re-check  and screening blood work so do not feel this needs to be done emergently here.  She understands she may return here at any time for new/acute changes.  She was discharged home in stable condition.  Final Clinical Impressions(s) / ED Diagnoses   Final diagnoses:  Anxiety attack    ED Discharge Orders        Ordered    hydrOXYzine (ATARAX/VISTARIL) 25 MG tablet  Every 6 hours PRN     10/12/17 0610       Larene Pickett, PA-C 10/12/17 0618    Ward, Delice Bison, DO 10/12/17 [phone]

## 2017-10-12 NOTE — Telephone Encounter (Signed)
Called to remind pt of their appt tomorrow 10/13/17  No VM left - No DPR for current number

## 2017-10-12 NOTE — ED Notes (Signed)
Bed: [facility]3 Expected date:  Expected time:  Means of arrival:  Comments: 

## 2017-10-12 NOTE — Discharge Instructions (Signed)
Take the prescribed medication as directed.  This will help calm your nerves.  If you do not feel like you need it, you do not have to take it. Follow-up with your doctor on Saturday for your scheduled testing. Return to the ED for new or worsening symptoms.

## 2017-10-13 ENCOUNTER — Encounter: Payer: Self-pay | Admitting: Physician Assistant

## 2017-10-13 ENCOUNTER — Ambulatory Visit: Payer: Self-pay | Admitting: Physician Assistant

## 2017-10-13 VITALS — BP 124/74 | HR 95 | Temp 97.9°F | Resp 18 | Ht 68.25 in | Wt 172.0 lb

## 2017-10-13 DIAGNOSIS — F439 Reaction to severe stress, unspecified: Secondary | ICD-10-CM

## 2017-10-13 DIAGNOSIS — K30 Functional dyspepsia: Secondary | ICD-10-CM

## 2017-10-13 DIAGNOSIS — R824 Acetonuria: Secondary | ICD-10-CM

## 2017-10-13 LAB — POCT URINALYSIS DIP (MANUAL ENTRY)
Blood, UA: NEGATIVE
Glucose, UA: NEGATIVE mg/dL
Leukocytes, UA: NEGATIVE
NITRITE UA: NEGATIVE
PH UA: 6 (ref 5.0–8.0)
Protein Ur, POC: 30 mg/dL — AB
Spec Grav, UA: 1.025 (ref 1.010–1.025)
Urobilinogen, UA: 1 E.U./dL

## 2017-10-13 LAB — POCT URINE PREGNANCY: Preg Test, Ur: NEGATIVE

## 2017-10-13 LAB — POCT CBC
Granulocyte percent: 58.4 %G (ref 37–80)
HCT, POC: 44.6 % (ref 37.7–47.9)
HEMOGLOBIN: 14.9 g/dL (ref 12.2–16.2)
Lymph, poc: 2.6 (ref 0.6–3.4)
MCH, POC: 30.7 pg (ref 27–31.2)
MCHC: 33.4 g/dL (ref 31.8–35.4)
MCV: 91.8 fL (ref 80–97)
MID (CBC): 0.7 (ref 0–0.9)
MPV: 9.2 fL (ref 0–99.8)
POC Granulocyte: 4.7 (ref 2–6.9)
POC LYMPH %: 32.7 % (ref 10–50)
POC MID %: 8.9 % (ref 0–12)
Platelet Count, POC: 224 10*3/uL (ref 142–424)
RBC: 4.86 M/uL (ref 4.04–5.48)
RDW, POC: 12.2 %
WBC: 8.1 10*3/uL (ref 4.6–10.2)

## 2017-10-13 LAB — POCT GLYCOSYLATED HEMOGLOBIN (HGB A1C): Hemoglobin A1C: 5.1

## 2017-10-13 NOTE — Patient Instructions (Addendum)
Famotidine 20 mg for a sour stomach.   Work hard to protect these positive life changes.  I will see you in about 1 month.     IF you received an x-ray today, you will receive an invoice from [facility]. Please contact [facility] at [phone] with questions or concerns regarding your invoice.   IF you received labwork today, you will receive an invoice from [facility]. Please contact [facility] at [phone] with questions or concerns regarding your invoice.   Our billing staff will not be able to assist you with questions regarding bills from these companies.  You will be contacted with the lab results as soon as they are available. The fastest way to get your results is to activate your My Chart account. Instructions are located on the last page of this paperwork. If you have not heard from [place] regarding the results in 2 weeks, please contact this office.

## 2017-10-13 NOTE — Progress Notes (Signed)
10/13/2017 11:34 AM   DOB: [birth year] / MRN: [id number]  SUBJECTIVE:  Hannah Weber is a 27 y.o. female presenting for gastritis that was diagnosed in the ED.  Noticed this after drinking cessation on January 4th.  Tells me that she had burnxing in her left upper quadrant and some into the chest. This seems to be consistently getting better. No bloody stools. No hematemesis. SHe has tried zofran and tums. She feels that she is about 70% better. Was drinking about 4 beers daily and was also smoking roughly 1/2 pack daily for about 3 years.   Notes some elevated BP over the last two weeks. Has been seen several providers since and all indicators point towards a stress response. Has a history of taking BP meds. Denies leg swelling, new doe.   SHe goes to bed at 10-11 and typically falls asleep in about 15 minutes. No early morning awakenings. Has some guilt over the past, including finances and relashionships.     She has No Known Allergies.   She  has a past medical history of Allergy and Hypertension.    She  reports that she has been smoking.  she has never used smokeless tobacco. She reports that she drinks alcohol. She reports that she uses drugs. Drug: Marijuana. She  reports that she does not engage in sexual activity. The patient  has no past surgical history on file.  Her family history includes Diabetes in her mother.  Review of Systems  Constitutional: Negative for chills, diaphoresis and fever.  Gastrointestinal: Negative for nausea.  Skin: Negative for rash.  Neurological: Negative for dizziness.  Psychiatric/Behavioral: Positive for depression. Negative for hallucinations, memory loss, substance abuse and suicidal ideas. The patient is nervous/anxious. The patient does not have insomnia.     The problem list and medications were reviewed and updated by myself where necessary and exist elsewhere in the encounter.   OBJECTIVE:  BP 124/74   Pulse 95   Temp 97.9 F (36.6  C) (Oral)   Resp 18   Ht 5' 8.25" (1.734 m)   Wt 172 lb (78 kg)   LMP 09/27/2017   SpO2 99%   BMI 25.96 kg/m   Physical Exam  Constitutional: She is oriented to person, place, and time. She is active.  Non-toxic appearance.  Eyes: EOM are normal. Pupils are equal, round, and reactive to light.  Cardiovascular: Normal rate, regular rhythm, S1 normal, S2 normal, normal heart sounds and intact distal pulses. Exam reveals no gallop, no friction rub and no decreased pulses.  No murmur heard. Pulmonary/Chest: Effort normal. No stridor. No tachypnea. No respiratory distress. She has no wheezes. She has no rales.  Abdominal: She exhibits no distension.  Musculoskeletal: She exhibits no edema.  Neurological: She is alert and oriented to person, place, and time. She has normal strength. She is not disoriented. She displays no atrophy. No cranial nerve deficit or sensory deficit. She exhibits normal muscle tone. Coordination and gait normal.  Skin: Skin is warm and dry. She is not diaphoretic. No pallor.  Psychiatric: Her behavior is normal.     BP Readings from Last 3 Encounters:  10/13/17 124/74  10/12/17 (!) 144/96  10/07/17 134/73     Results for orders placed or performed in visit on 10/13/17 (from the past 72 hour(s))  POCT CBC     Status: None   Collection Time: 10/13/17 11:20 AM  Result Value Ref Range   WBC 8.1 4.6 - 10.2 K/uL  Lymph, poc 2.6 0.6 - 3.4   POC LYMPH PERCENT 32.7 10 - 50 %L   MID (cbc) 0.7 0 - 0.9   POC MID % 8.9 0 - 12 %M   POC Granulocyte 4.7 2 - 6.9   Granulocyte percent 58.4 37 - 80 %G   RBC 4.86 4.04 - 5.48 M/uL   Hemoglobin 14.9 12.2 - 16.2 g/dL   HCT, POC 44.6 37.7 - 47.9 %   MCV 91.8 80 - 97 fL   MCH, POC 30.7 27 - 31.2 pg   MCHC 33.4 31.8 - 35.4 g/dL   RDW, POC 12.2 %   Platelet Count, POC 224 142 - 424 K/uL   MPV 9.2 0 - 99.8 fL  POCT urinalysis dipstick     Status: Abnormal   Collection Time: 10/13/17 11:24 AM  Result Value Ref Range    Color, UA yellow yellow   Clarity, UA cloudy (A) clear   Glucose, UA negative negative mg/dL   Bilirubin, UA moderate (A) negative   Ketones, POC UA large (80) (A) negative mg/dL   Spec Grav, UA 1.025 1.010 - 1.025   Blood, UA negative negative   pH, UA 6.0 5.0 - 8.0   Protein Ur, POC =30 (A) negative mg/dL   Urobilinogen, UA 1.0 0.2 or 1.0 E.U./dL   Nitrite, UA Negative Negative   Leukocytes, UA Negative Negative    No results found.  ASSESSMENT AND PLAN:  Hannah Weber was seen today for establish care and gi problem.  Diagnoses and all orders for this visit:  Stress: She may benefit from counseling.  I am not sure if she will benefit from a antidepressant.  She sleeps well and denies anhedonia. She has significant guilt however I think that she feels she is not living living up to her potential and her parents expectations.    Burning sensation of stomach: 85% improved since cessation of drinking.  Advised famotidine as needed per AVS.  -     POCT CBC -     POCT urinalysis dipstick -     Basic Metabolic Panel -     POCT urine pregnancy  Ketonuria: Will need to check her again.  SHe denies drinking and is not a diabetic. There is no sugar in her urine.  BMET is perfect.  This may be dietary.  Will see her back in one month.  She will come back sooner if she develops any new symptoms.  -     POCT glycosylated hemoglobin (Hb A1C) -     Hepatic function panel    The patient is advised to call or return to clinic if she does not see an improvement in symptoms, or to seek the care of the closest emergency department if she worsens with the above plan.   Hannah Weber, MHS, PA-C [facility] 10/13/2017 11:34 AM

## 2017-10-14 LAB — BASIC METABOLIC PANEL
BUN/Creatinine Ratio: 11 (ref 9–23)
BUN: 10 mg/dL (ref 6–20)
CALCIUM: 9.7 mg/dL (ref 8.7–10.2)
CHLORIDE: 100 mmol/L (ref 96–106)
CO2: 23 mmol/L (ref 20–29)
Creatinine, Ser: 0.9 mg/dL (ref 0.57–1.00)
GFR calc Af Amer: 102 mL/min/{1.73_m2} (ref >59)
GFR calc non Af Amer: 89 mL/min/{1.73_m2} (ref >59)
GLUCOSE: 77 mg/dL (ref 65–99)
Potassium: 3.9 mmol/L (ref 3.5–5.2)
Sodium: 141 mmol/L (ref 134–144)

## 2017-10-14 LAB — HEPATIC FUNCTION PANEL
ALBUMIN: 4.9 g/dL (ref 3.5–5.5)
ALT: 30 IU/L (ref 0–32)
AST: 29 IU/L (ref 0–40)
Alkaline Phosphatase: 65 IU/L (ref 39–117)
BILIRUBIN, DIRECT: 0.14 mg/dL (ref 0.00–0.40)
Bilirubin Total: 0.4 mg/dL (ref 0.0–1.2)
TOTAL PROTEIN: 7.9 g/dL (ref 6.0–8.5)

## 2017-11-09 ENCOUNTER — Ambulatory Visit (INDEPENDENT_AMBULATORY_CARE_PROVIDER_SITE_OTHER): Payer: Self-pay | Admitting: Emergency Medicine

## 2017-11-09 ENCOUNTER — Other Ambulatory Visit: Payer: Self-pay

## 2017-11-09 ENCOUNTER — Encounter: Payer: Self-pay | Admitting: Emergency Medicine

## 2017-11-09 VITALS — BP 138/70 | HR 97 | Temp 98.8°F | Resp 16 | Ht 67.0 in | Wt 169.8 lb

## 2017-11-09 DIAGNOSIS — M7661 Achilles tendinitis, right leg: Secondary | ICD-10-CM

## 2017-11-09 DIAGNOSIS — M7662 Achilles tendinitis, left leg: Secondary | ICD-10-CM

## 2017-11-09 MED ORDER — DICLOFENAC SODIUM 75 MG PO TBEC: 75.0000 mg | DELAYED_RELEASE_TABLET | Freq: Two times a day (BID) | ORAL | 0 refills | Status: AC

## 2017-11-09 NOTE — Patient Instructions (Addendum)
IF you received an x-ray today, you will receive an invoice from [facility]. Please contact [facility] at [phone] with questions or concerns regarding your invoice.   IF you received labwork today, you will receive an invoice from [facility]. Please contact [facility] at [phone] with questions or concerns regarding your invoice.   Our billing staff will not be able to assist you with questions regarding bills from these companies.  You will be contacted with the lab results as soon as they are available. The fastest way to get your results is to activate your My Chart account. Instructions are located on the last page of this paperwork. If you have not heard from [place] regarding the results in 2 weeks, please contact this office.     Achilles Tendinitis Achilles tendinitis is inflammation of the tough, cord-like band that attaches the lower leg muscles to the heel bone (Achilles tendon). This is usually caused by overusing the tendon and the ankle joint. Achilles tendinitis usually gets better over time with treatment and caring for yourself at home. It can take weeks or months to heal completely. What are the causes? This condition may be caused by:  A sudden increase in exercise or activity, such as running.  Doing the same exercises or activities (such as jumping) over and over.  Not warming up calf muscles before exercising.  Exercising in shoes that are worn out or not made for exercise.  Having arthritis or a bone growth (spur) on the back of the heel bone. This can rub against the tendon and hurt it.  Age-related wear and tear. Tendons become less flexible with age and more likely to be injured.  What are the signs or symptoms? Common symptoms of this condition include:  Pain in the Achilles tendon or in the back of the leg, just above the heel. The pain usually gets worse with exercise.  Stiffness or soreness in the back of the leg, especially  in the morning.  Swelling of the skin over the Achilles tendon.  Thickening of the tendon.  Bone spurs at the bottom of the Achilles tendon, near the heel.  Trouble standing on tiptoe.  How is this diagnosed? This condition is diagnosed based on your symptoms and a physical exam. You may have tests, including:  X-rays.  MRI.  How is this treated? The goal of treatment is to relieve symptoms and help your injury heal. Treatment may include:  Decreasing or stopping activities that caused the tendinitis. This may mean switching to low-impact exercises like biking or swimming.  Icing the injured area.  Doing physical therapy, including strengthening and stretching exercises.  NSAIDs to help relieve pain and swelling.  Using supportive shoes, wraps, heel lifts, or a walking boot (air cast).  Surgery. This may be done if your symptoms do not improve after 6 months.  Using high-energy shock wave impulses to stimulate the healing process (extracorporeal shock wave therapy). This is rare.  Injection of medicines to help relieve inflammation (corticosteroids). This is rare.  Follow these instructions at home: If you have an air cast:  Wear the cast as told by your health care provider. Remove it only as told by your health care provider.  Loosen the cast if your toes tingle, become numb, or turn cold and blue. Activity  Gradually return to your normal activities once your health care provider approves. Do not do activities that cause pain. ? Consider doing low-impact exercises, like cycling or swimming.  If you have an air cast, ask your health care provider when it is safe for you to drive.  If physical therapy was prescribed, do exercises as told by your health care provider or physical therapist. Managing pain, stiffness, and swelling  Raise (elevate) your foot above the level of your heart while you are sitting or lying down.  Move your toes often to avoid stiffness  and to lessen swelling.  If directed, put ice on the injured area: ? Put ice in a plastic bag. ? Place a towel between your skin and the bag. ? Leave the ice on for 20 minutes, 2-3 times a day General instructions  If directed, wrap your foot with an elastic bandage or other wrap. This can help keep your tendon from moving too much while it heals. Your health care provider will show you how to wrap your foot correctly.  Wear supportive shoes or heel lifts only as told by your health care provider.  Take over-the-counter and prescription medicines only as told by your health care provider.  Keep all follow-up visits as told by your health care provider. This is important. Contact a health care provider if:  You have symptoms that gets worse.  You have pain that does not get better with medicine.  You develop new, unexplained symptoms.  You develop warmth and swelling in your foot.  You have a fever. Get help right away if:  You have a sudden popping sound or sensation in your Achilles tendon followed by severe pain.  You cannot move your toes or foot.  You cannot put any weight on your foot. Summary  Achilles tendinitis is inflammation of the tough, cord-like band that attaches the lower leg muscles to the heel bone (Achilles tendon).  This condition is usually caused by overusing the tendon and the ankle joint. It can also be caused by arthritis or normal aging.  The most common symptoms of this condition include pain, swelling, or stiffness in the Achilles tendon or in the back of the leg.  This condition is usually treated with rest, NSAIDs, and physical therapy. This information is not intended to replace advice given to you by your health care provider. Make sure you discuss any questions you have with your health care provider. Document Released: 06/28/2005 Document Revised: 08/07/2016 Document Reviewed: 08/07/2016 Elsevier Interactive Patient Education  2017  [employer].

## 2017-11-09 NOTE — Progress Notes (Signed)
Hannah Weber 27 y.o.   Chief Complaint  Patient presents with  . Ankle Pain    1 1/2 week - BOTH    HISTORY OF PRESENT ILLNESS: This is a 27 y.o. female complaining of pain to both Achilles tendons for the past 2 weeks ever since starting to run and jump more playing flag football.  HPI   Prior to Admission medications   Medication Sig Start Date End Date Taking? Authorizing Provider  hydrOXYzine (ATARAX/VISTARIL) 25 MG tablet Take 1 tablet (25 mg total) by mouth every 6 (six) hours as needed for anxiety. Patient not taking: Reported on 11/09/2017 10/12/17   Larene Pickett, PA-C  ondansetron (ZOFRAN-ODT) 8 MG disintegrating tablet Take 1 tablet (8 mg total) by mouth every 8 (eight) hours as needed for nausea. Patient not taking: Reported on 11/09/2017 10/07/17   Jaynee Eagles, PA-C    No Known Allergies  Patient Active Problem List   Diagnosis Date Noted  . Smoker unmotivated to quit 09/07/2015    Past Medical History:  Diagnosis Date  . Allergy   . Hypertension     No past surgical history on file.  Social History   Socioeconomic History  . Marital status: Single    Spouse name: Not on file  . Number of children: Not on file  . Years of education: Not on file  . Highest education level: Not on file  Social Needs  . Financial resource strain: Not on file  . Food insecurity - worry: Not on file  . Food insecurity - inability: Not on file  . Transportation needs - medical: Not on file  . Transportation needs - non-medical: Not on file  Occupational History  . Not on file  Tobacco Use  . Smoking status: Current Every Day Smoker  . Smokeless tobacco: Never Used  Substance and Sexual Activity  . Alcohol use: Yes    Alcohol/week: 0.0 oz  . Drug use: Yes    Types: Marijuana  . Sexual activity: No    Birth control/protection: None  Other Topics Concern  . Not on file  Social History Narrative  . Not on file    Family History  Problem Relation Age of Onset    . Diabetes Mother      Review of Systems  Constitutional: Negative.  Negative for chills and fever.  Respiratory: Negative for cough.   Gastrointestinal: Negative for nausea and vomiting.  Musculoskeletal: Positive for joint pain.  Skin: Negative for rash.  All other systems reviewed and are negative.    Physical Exam  Constitutional: She is oriented to person, place, and time. She appears well-developed and well-nourished.  HENT:  Head: Normocephalic.  Neck: Normal range of motion.  Cardiovascular: Normal rate.  Pulmonary/Chest: Effort normal.  Musculoskeletal:  Achilles tendon bilateral: Positive tenderness with minimal inflammation.  Full range of motion of both ankles. Feet: Full range of motion, NVI  Neurological: She is alert and oriented to person, place, and time.  Skin: Skin is warm and dry. Capillary refill takes less than 2 seconds.  Psychiatric: She has a normal mood and affect. Her behavior is normal.  Vitals reviewed.    ASSESSMENT & PLAN: Hannah Weber was seen today for ankle pain.  Diagnoses and all orders for this visit:  Achilles tendinitis of both lower extremities -     Ambulatory referral to Podiatry  Other orders -     diclofenac (VOLTAREN) 75 MG EC tablet; Take 1 tablet (75 mg total) by  mouth 2 (two) times daily for 5 days.    Patient Instructions       IF you received an x-ray today, you will receive an invoice from [facility]. Please contact [facility] at [phone] with questions or concerns regarding your invoice.   IF you received labwork today, you will receive an invoice from [facility]. Please contact [facility] at [phone] with questions or concerns regarding your invoice.   Our billing staff will not be able to assist you with questions regarding bills from these companies.  You will be contacted with the lab results as soon as they are available. The fastest way to get your results is to activate your My  Chart account. Instructions are located on the last page of this paperwork. If you have not heard from [place] regarding the results in 2 weeks, please contact this office.     Achilles Tendinitis Achilles tendinitis is inflammation of the tough, cord-like band that attaches the lower leg muscles to the heel bone (Achilles tendon). This is usually caused by overusing the tendon and the ankle joint. Achilles tendinitis usually gets better over time with treatment and caring for yourself at home. It can take weeks or months to heal completely. What are the causes? This condition may be caused by:  A sudden increase in exercise or activity, such as running.  Doing the same exercises or activities (such as jumping) over and over.  Not warming up calf muscles before exercising.  Exercising in shoes that are worn out or not made for exercise.  Having arthritis or a bone growth (spur) on the back of the heel bone. This can rub against the tendon and hurt it.  Age-related wear and tear. Tendons become less flexible with age and more likely to be injured.  What are the signs or symptoms? Common symptoms of this condition include:  Pain in the Achilles tendon or in the back of the leg, just above the heel. The pain usually gets worse with exercise.  Stiffness or soreness in the back of the leg, especially in the morning.  Swelling of the skin over the Achilles tendon.  Thickening of the tendon.  Bone spurs at the bottom of the Achilles tendon, near the heel.  Trouble standing on tiptoe.  How is this diagnosed? This condition is diagnosed based on your symptoms and a physical exam. You may have tests, including:  X-rays.  MRI.  How is this treated? The goal of treatment is to relieve symptoms and help your injury heal. Treatment may include:  Decreasing or stopping activities that caused the tendinitis. This may mean switching to low-impact exercises like biking or swimming.  Icing  the injured area.  Doing physical therapy, including strengthening and stretching exercises.  NSAIDs to help relieve pain and swelling.  Using supportive shoes, wraps, heel lifts, or a walking boot (air cast).  Surgery. This may be done if your symptoms do not improve after 6 months.  Using high-energy shock wave impulses to stimulate the healing process (extracorporeal shock wave therapy). This is rare.  Injection of medicines to help relieve inflammation (corticosteroids). This is rare.  Follow these instructions at home: If you have an air cast:  Wear the cast as told by your health care provider. Remove it only as told by your health care provider.  Loosen the cast if your toes tingle, become numb, or turn cold and blue. Activity  Gradually return to your normal activities once your health care provider approves.  Do not do activities that cause pain. ? Consider doing low-impact exercises, like cycling or swimming.  If you have an air cast, ask your health care provider when it is safe for you to drive.  If physical therapy was prescribed, do exercises as told by your health care provider or physical therapist. Managing pain, stiffness, and swelling  Raise (elevate) your foot above the level of your heart while you are sitting or lying down.  Move your toes often to avoid stiffness and to lessen swelling.  If directed, put ice on the injured area: ? Put ice in a plastic bag. ? Place a towel between your skin and the bag. ? Leave the ice on for 20 minutes, 2-3 times a day General instructions  If directed, wrap your foot with an elastic bandage or other wrap. This can help keep your tendon from moving too much while it heals. Your health care provider will show you how to wrap your foot correctly.  Wear supportive shoes or heel lifts only as told by your health care provider.  Take over-the-counter and prescription medicines only as told by your health care  provider.  Keep all follow-up visits as told by your health care provider. This is important. Contact a health care provider if:  You have symptoms that gets worse.  You have pain that does not get better with medicine.  You develop new, unexplained symptoms.  You develop warmth and swelling in your foot.  You have a fever. Get help right away if:  You have a sudden popping sound or sensation in your Achilles tendon followed by severe pain.  You cannot move your toes or foot.  You cannot put any weight on your foot. Summary  Achilles tendinitis is inflammation of the tough, cord-like band that attaches the lower leg muscles to the heel bone (Achilles tendon).  This condition is usually caused by overusing the tendon and the ankle joint. It can also be caused by arthritis or normal aging.  The most common symptoms of this condition include pain, swelling, or stiffness in the Achilles tendon or in the back of the leg.  This condition is usually treated with rest, NSAIDs, and physical therapy. This information is not intended to replace advice given to you by your health care provider. Make sure you discuss any questions you have with your health care provider. Document Released: 06/28/2005 Document Revised: 08/07/2016 Document Reviewed: 08/07/2016 Elsevier Interactive Patient Education  2017 Elsevier Inc.      Agustina Caroli, MD [facility]

## 2017-12-01 ENCOUNTER — Ambulatory Visit: Payer: Self-pay | Admitting: Physician Assistant

## 2018-01-03 ENCOUNTER — Other Ambulatory Visit: Payer: Self-pay

## 2018-01-03 ENCOUNTER — Encounter (HOSPITAL_COMMUNITY): Payer: Self-pay | Admitting: Emergency Medicine

## 2018-01-03 DIAGNOSIS — R109 Unspecified abdominal pain: Secondary | ICD-10-CM | POA: Insufficient documentation

## 2018-01-03 DIAGNOSIS — Z5321 Procedure and treatment not carried out due to patient leaving prior to being seen by health care provider: Secondary | ICD-10-CM | POA: Insufficient documentation

## 2018-01-03 NOTE — ED Triage Notes (Signed)
Pt from home with c/o right flank pain x 2 days. Pt states on Sat she went grocery shopping and lifted heavy jugs. Pt states that could be the cause of her pain. Pt denies abdominal discomfort, n/v/d, and denies urinary symptoms.

## 2018-01-04 ENCOUNTER — Emergency Department (HOSPITAL_COMMUNITY)
Admission: EM | Admit: 2018-01-04 | Discharge: 2018-01-04 | Disposition: A | Discharge status: Home / Self Care | Payer: Self-pay | Attending: Emergency Medicine | Admitting: Emergency Medicine

## 2018-01-04 NOTE — ED Notes (Signed)
PT CALLED FROM WAITING ROOM X3 NO RESPONSE

## 2018-01-04 NOTE — ED Notes (Signed)
01/04/2018,  Attempted follow-up call. No answer.

## 2018-01-06 ENCOUNTER — Encounter (HOSPITAL_COMMUNITY): Payer: Self-pay | Admitting: *Deleted

## 2018-01-06 ENCOUNTER — Ambulatory Visit (HOSPITAL_COMMUNITY)
Admission: EM | Admit: 2018-01-06 | Discharge: 2018-01-06 | Disposition: A | Discharge status: Home / Self Care | Payer: PRIVATE HEALTH INSURANCE | Attending: Internal Medicine | Admitting: Internal Medicine

## 2018-01-06 ENCOUNTER — Other Ambulatory Visit: Payer: Self-pay

## 2018-01-06 DIAGNOSIS — R109 Unspecified abdominal pain: Secondary | ICD-10-CM | POA: Diagnosis not present

## 2018-01-06 LAB — POCT URINALYSIS DIP (DEVICE)
Bilirubin Urine: NEGATIVE
GLUCOSE, UA: NEGATIVE mg/dL
Ketones, ur: 15 mg/dL — AB
LEUKOCYTES UA: NEGATIVE
Nitrite: NEGATIVE
PROTEIN: NEGATIVE mg/dL
SPECIFIC GRAVITY, URINE: 1.025 (ref 1.005–1.030)
Urobilinogen, UA: 0.2 mg/dL (ref 0.0–1.0)
pH: 5.5 (ref 5.0–8.0)

## 2018-01-06 MED ORDER — NAPROXEN 500 MG PO TABS: 500.0000 mg | ORAL_TABLET | Freq: Two times a day (BID) | ORAL | 0 refills | Status: DC

## 2018-01-06 NOTE — ED Provider Notes (Signed)
01/06/2018 6:28 PM   DOB: [birth year] / MRN: [id number]  SUBJECTIVE:  Hannah Weber is a 27 y.o. female presenting for right sided flank pain.  This started a few weeks ago after moving some boxes of water bottles.  She denies nausea loss of appetite, change in bowel movements, bloody stools.  She is in a female female relationship.  She denies fever.  The pain is worsened with certain movements such as twisting.  She is allergic to apple and other.   She  has a past medical history of Allergy and Hypertension.    She  reports that she has been smoking.  She has never used smokeless tobacco. She reports that she drank alcohol. She reports that she has current or past drug history. Drug: Marijuana. She  reports that she does not engage in sexual activity. The patient  has no past surgical history on file.  Her family history includes Diabetes in her mother.  Review of Systems  Constitutional: Negative for chills, diaphoresis and fever.  Eyes: Negative.   Respiratory: Negative for cough, hemoptysis, sputum production, shortness of breath and wheezing.   Cardiovascular: Negative for chest pain, orthopnea and leg swelling.  Gastrointestinal: Positive for abdominal pain. Negative for blood in stool, constipation, diarrhea, heartburn, melena, nausea and vomiting.  Genitourinary: Negative for dysuria, flank pain, frequency, hematuria and urgency.  Skin: Negative for rash.  Neurological: Negative for dizziness, sensory change, speech change, focal weakness and headaches.    OBJECTIVE:  BP (!) 138/92   Pulse 79   Temp 98.2 F (36.8 C) (Oral)   Resp 16   LMP 01/01/2018   SpO2 100%   Physical Exam  Constitutional: She is active.  Non-toxic appearance.  Cardiovascular: Normal rate, regular rhythm, S1 normal, S2 normal, normal heart sounds and intact distal pulses. Exam reveals no gallop, no friction rub and no decreased pulses.  No murmur heard. Pulmonary/Chest: Effort normal. No stridor. No  tachypnea. No respiratory distress. She has no wheezes. She has no rales.  Abdominal: Soft. Normal appearance and bowel sounds are normal. She exhibits no distension and no mass. There is no tenderness. There is no rigidity, no rebound, no guarding and no CVA tenderness.  Musculoskeletal: She exhibits no edema.  Neurological: She is alert.  Skin: Skin is warm and dry. She is not diaphoretic. No pallor.    Results for orders placed or performed during the hospital encounter of 01/06/18 (from the past 72 hour(s))  POCT urinalysis dip (device)     Status: Abnormal   Collection Time: 01/06/18  6:02 PM  Result Value Ref Range   Glucose, UA NEGATIVE NEGATIVE mg/dL   Bilirubin Urine NEGATIVE NEGATIVE   Ketones, ur 15 (A) NEGATIVE mg/dL   Specific Gravity, Urine 1.025 1.005 - 1.030   Hgb urine dipstick TRACE (A) NEGATIVE   pH 5.5 5.0 - 8.0   Protein, ur NEGATIVE NEGATIVE mg/dL   Urobilinogen, UA 0.2 0.0 - 1.0 mg/dL   Nitrite NEGATIVE NEGATIVE   Leukocytes, UA NEGATIVE NEGATIVE    Comment: Biochemical Testing Only. Please order routine urinalysis from main lab if confirmatory testing is needed.    No results found.  ASSESSMENT AND PLAN:  Orders Placed This Encounter  Procedures  . POCT urinalysis dip (device)    Standing Status:   Standing    Number of Occurrences:   1     Flank pain - Most likely muscular.  She denies urinary symptoms.  She is in a same-sex relationship  and does not sleep with men.  She has an excellent abdominal exam today.  If her symptoms return I will see her at [address] has had seen her there in the past and can pursue further workup if needed.      The patient is advised to call or return to clinic if she does not see an improvement in symptoms, or to seek the care of the closest emergency department if she worsens with the above plan.   Philis Fendt, MHS, PA-C 01/06/2018 6:28 PM   Tereasa Coop, PA-C 01/06/18 1830

## 2018-01-06 NOTE — Discharge Instructions (Addendum)
If you begin to lose your appetite, develop fever, or your bowel stop moving then please return to me at the [place] location for re-evaluation.

## 2018-01-06 NOTE — ED Triage Notes (Signed)
Reports having intermittent right lateral abd pains x approx 1 wk without n/v/d or fevers.

## 2018-01-12 ENCOUNTER — Ambulatory Visit (INDEPENDENT_AMBULATORY_CARE_PROVIDER_SITE_OTHER): Payer: PRIVATE HEALTH INSURANCE | Admitting: Physician Assistant

## 2018-01-12 ENCOUNTER — Encounter: Payer: Self-pay | Admitting: Physician Assistant

## 2018-01-12 ENCOUNTER — Other Ambulatory Visit: Payer: Self-pay

## 2018-01-12 VITALS — BP 128/72 | HR 86 | Temp 98.3°F | Resp 18 | Ht 67.0 in | Wt 172.6 lb

## 2018-01-12 DIAGNOSIS — R824 Acetonuria: Secondary | ICD-10-CM | POA: Insufficient documentation

## 2018-01-12 DIAGNOSIS — R109 Unspecified abdominal pain: Secondary | ICD-10-CM | POA: Diagnosis not present

## 2018-01-12 MED ORDER — AZELASTINE HCL 0.1 % NA SOLN: 2.0000 | Freq: Two times a day (BID) | NASAL | 0 refills | Status: DC

## 2018-01-12 NOTE — Progress Notes (Signed)
Patient ID: Hannah Weber, female    DOB: 1990/12/26, 27 y.o.   MRN: [id number]  PCP: System, Provider Not In  Chief Complaint  Patient presents with  . Ear Drainage    thinks she is having ear drainage problems  . Flank Pain    x1week having side pain follow up from [place]:   Presents for evaluation of RIGHT flank pain and allergies. She is accompanied by her partner, Mertie Clause.  Seen at [facility] with RIGHT sided flank pain on 01/06/2018. UA dip was notable for ketones and trace hgb. She was diagnosed with musculoskeletal flank pain and prescribed naproxen. She was advised to present here if symptoms persisted.  Today she reports feeling much better, only minimal pain, but wanting to make sure everything is ok. Stopped naproxen 3 days ago. No urinary urgency, frequency, burning, hematuria. No fever, chills, abdominal pain.  Since her UC visit, her allergies have worsened. 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 Associated nasal congestion, runny nose, PND, mild non-productive cough, sneezing. Usually takes cetirizine seasonally, but has not started it this year. Doesn't like nasal sprays, seems to make her more congested.    Review of Systems As above    Patient Active Problem List   Diagnosis Date Noted  . Achilles tendinitis of both lower extremities 11/09/2017  . Smoker unmotivated to quit 09/07/2015     Prior to Admission medications   Medication Sig Start Date End Date Taking? Authorizing Provider  naproxen (NAPROSYN) 500 MG tablet Take 1 tablet (500 mg total) by mouth 2 (two) times daily. Patient not taking: Reported on 01/12/2018 01/06/18   Tereasa Coop, PA-C     Allergies  Allergen Reactions  . Apple   . Other     mangos       Objective:  Physical Exam  Constitutional: She is oriented to person, place, and time. She appears well-developed and  well-nourished. She is active and cooperative. No distress.  BP 128/72   Pulse 86   Temp 98.3 F (36.8 C) (Oral)   Resp 18   Ht 5\' 7"  (1.702 m)   Wt 172 lb 9.6 oz (78.3 kg)   LMP 01/01/2018   SpO2 98%   BMI 27.03 kg/m   HENT:  Head: Normocephalic and atraumatic.  Right Ear: Hearing, tympanic membrane, external ear and ear canal normal.  Left Ear: Hearing, tympanic membrane, external ear and ear canal normal.  Nose: Nose normal.  Mouth/Throat: Uvula is midline, oropharynx is clear and moist and mucous membranes are normal. No oropharyngeal exudate.  Eyes: Pupils are equal, round, and reactive to light. Conjunctivae are normal. No scleral icterus.  Neck: Trachea normal, normal range of motion and phonation normal. Neck supple. No thyromegaly present.  Cardiovascular: Normal rate, regular rhythm and normal heart sounds.  Pulses:      Radial pulses are 2+ on the right side, and 2+ on the left side.  Pulmonary/Chest: Effort normal and breath sounds normal.  Abdominal: Soft. Normal appearance and bowel sounds are normal. She exhibits no distension, no pulsatile liver, no fluid wave, no ascites, no pulsatile midline mass and no mass. There is no hepatosplenomegaly. There is no tenderness. There is no CVA tenderness. No hernia.  Musculoskeletal:       Cervical back: Normal.       Thoracic back: Normal.  Lumbar back: Normal.  Lymphadenopathy:       Head (right side): No tonsillar, no preauricular, no posterior auricular and no occipital adenopathy present.       Head (left side): No tonsillar, no preauricular, no posterior auricular and no occipital adenopathy present.    She has no cervical adenopathy.       Right: No supraclavicular adenopathy present.       Left: No supraclavicular adenopathy present.  Neurological: She is alert and oriented to person, place, and time. No sensory deficit.  Skin: Skin is warm, dry and intact. No rash noted. No cyanosis or erythema. Nails show no  clubbing.  Psychiatric: She has a normal mood and affect. Her speech is normal and behavior is normal.       Assessment & Plan:   Problem List Items Addressed This Visit    Ketonuria    Repeat UA today.       Relevant Orders   Urinalysis, dipstick only   Urine Microscopic    Other Visit Diagnoses    Acute right flank pain    -  Primary   Much improved. Advised PRN naproxen, heat/ice, rest.   Relevant Orders   Urinalysis, dipstick only   Urine Microscopic       Return if symptoms worsen or fail to improve.   Fara Chute, PA-C [facility]

## 2018-01-12 NOTE — Patient Instructions (Addendum)
Resume the Zyrtec. Use the nasal spray if needed.    IF you received an x-ray today, you will receive an invoice from [facility]. Please contact [facility] at [phone] with questions or concerns regarding your invoice.   IF you received labwork today, you will receive an invoice from [facility]. Please contact [facility] at [phone] with questions or concerns regarding your invoice.   Our billing staff will not be able to assist you with questions regarding bills from these companies.  You will be contacted with the lab results as soon as they are available. The fastest way to get your results is to activate your My Chart account. Instructions are located on the last page of this paperwork. If you have not heard from [place] regarding the results in 2 weeks, please contact this office.

## 2018-01-12 NOTE — Assessment & Plan Note (Signed)
Repeat UA today

## 2018-01-13 LAB — URINALYSIS, DIPSTICK ONLY
Bilirubin, UA: NEGATIVE
Glucose, UA: NEGATIVE
KETONES UA: NEGATIVE
LEUKOCYTES UA: NEGATIVE
Nitrite, UA: NEGATIVE
PROTEIN UA: NEGATIVE
RBC, UA: NEGATIVE
Specific Gravity, UA: 1.02 (ref 1.005–1.030)
Urobilinogen, Ur: 0.2 mg/dL (ref 0.2–1.0)
pH, UA: 6 (ref 5.0–7.5)

## 2018-01-14 LAB — URINALYSIS, MICROSCOPIC ONLY: CASTS: NONE SEEN /LPF

## 2018-01-14 LAB — SPECIMEN STATUS REPORT

## 2018-09-04 ENCOUNTER — Encounter (HOSPITAL_COMMUNITY): Payer: Self-pay | Admitting: Emergency Medicine

## 2018-09-04 ENCOUNTER — Emergency Department (HOSPITAL_COMMUNITY)
Admission: EM | Admit: 2018-09-04 | Discharge: 2018-09-04 | Disposition: A | Discharge status: Home / Self Care | Payer: PRIVATE HEALTH INSURANCE | Attending: Emergency Medicine | Admitting: Emergency Medicine

## 2018-09-04 ENCOUNTER — Emergency Department (HOSPITAL_COMMUNITY): Payer: PRIVATE HEALTH INSURANCE

## 2018-09-04 ENCOUNTER — Other Ambulatory Visit: Payer: Self-pay

## 2018-09-04 DIAGNOSIS — I10 Essential (primary) hypertension: Secondary | ICD-10-CM | POA: Diagnosis not present

## 2018-09-04 DIAGNOSIS — R51 Headache: Secondary | ICD-10-CM | POA: Insufficient documentation

## 2018-09-04 DIAGNOSIS — F17200 Nicotine dependence, unspecified, uncomplicated: Secondary | ICD-10-CM | POA: Diagnosis not present

## 2018-09-04 DIAGNOSIS — R519 Headache, unspecified: Secondary | ICD-10-CM

## 2018-09-04 LAB — BASIC METABOLIC PANEL
Anion gap: 10 (ref 5–15)
BUN: 9 mg/dL (ref 6–20)
CALCIUM: 9.7 mg/dL (ref 8.9–10.3)
CHLORIDE: 104 mmol/L (ref 98–111)
CO2: 25 mmol/L (ref 22–32)
CREATININE: 0.78 mg/dL (ref 0.44–1.00)
Glucose, Bld: 94 mg/dL (ref 70–99)
Potassium: 3.5 mmol/L (ref 3.5–5.1)
Sodium: 139 mmol/L (ref 135–145)

## 2018-09-04 LAB — CBC WITH DIFFERENTIAL/PLATELET
Abs Immature Granulocytes: 0.03 10*3/uL (ref 0.00–0.07)
BASOS ABS: 0 10*3/uL (ref 0.0–0.1)
BASOS PCT: 0 %
EOS ABS: 0.1 10*3/uL (ref 0.0–0.5)
EOS PCT: 1 %
HCT: 41.2 % (ref 36.0–46.0)
Hemoglobin: 13.7 g/dL (ref 12.0–15.0)
Immature Granulocytes: 0 %
LYMPHS PCT: 29 %
Lymphs Abs: 3.3 10*3/uL (ref 0.7–4.0)
MCH: 28.8 pg (ref 26.0–34.0)
MCHC: 33.3 g/dL (ref 30.0–36.0)
MCV: 86.7 fL (ref 80.0–100.0)
MONO ABS: 0.6 10*3/uL (ref 0.1–1.0)
Monocytes Relative: 5 %
NRBC: 0 % (ref 0.0–0.2)
Neutro Abs: 7.4 10*3/uL (ref 1.7–7.7)
Neutrophils Relative %: 65 %
PLATELETS: 271 10*3/uL (ref 150–400)
RBC: 4.75 MIL/uL (ref 3.87–5.11)
RDW: 13.2 % (ref 11.5–15.5)
WBC: 11.4 10*3/uL — AB (ref 4.0–10.5)

## 2018-09-04 LAB — I-STAT BETA HCG BLOOD, ED (MC, WL, AP ONLY)

## 2018-09-04 MED ORDER — KETOROLAC TROMETHAMINE 30 MG/ML IJ SOLN: 30.0000 mg | Freq: Once | INTRAMUSCULAR | Status: DC

## 2018-09-04 MED ORDER — DIPHENHYDRAMINE HCL 50 MG/ML IJ SOLN
25.0000 mg | Freq: Once | INTRAMUSCULAR | Status: AC
  Administered 2018-09-04: 25 mg via INTRAVENOUS
  Filled 2018-09-04: qty 1

## 2018-09-04 MED ORDER — DEXAMETHASONE SODIUM PHOSPHATE 10 MG/ML IJ SOLN
4.0000 mg | Freq: Once | INTRAMUSCULAR | Status: AC
  Administered 2018-09-04: 4 mg via INTRAVENOUS
  Filled 2018-09-04: qty 1

## 2018-09-04 MED ORDER — METOCLOPRAMIDE HCL 10 MG PO TABS: 10.0000 mg | ORAL_TABLET | Freq: Four times a day (QID) | ORAL | 0 refills | Status: DC | PRN

## 2018-09-04 MED ORDER — METOCLOPRAMIDE HCL 5 MG/ML IJ SOLN
10.0000 mg | Freq: Once | INTRAMUSCULAR | Status: AC
  Administered 2018-09-04: 10 mg via INTRAVENOUS
  Filled 2018-09-04: qty 2

## 2018-09-04 MED ORDER — SODIUM CHLORIDE 0.9 % IV BOLUS
1000.0000 mL | Freq: Once | INTRAVENOUS | Status: AC
  Administered 2018-09-04: 1000 mL via INTRAVENOUS

## 2018-09-04 NOTE — ED Notes (Signed)
Patient transported to CT 

## 2018-09-04 NOTE — ED Notes (Signed)
Patient verbalizes understanding of discharge instructions. Opportunity for questioning and answers were provided. Armband removed by staff, pt discharged from ED ambulatory.   
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
## 2018-09-04 NOTE — ED Triage Notes (Signed)
Pt presents to ED for assessment of posterior headache and over the eyebrows in the front.  States ibuprofen and tylenol have not helped at home.  Headache started last week some time.  Denies blurred vision, no focal neuro deficits noted in triage.

## 2018-09-04 NOTE — Discharge Instructions (Signed)
Take tylenol, motrin for headaches   Take reglan for severe headaches   See your doctor. Consider following up with neurologist if you have worse headaches   Return to ER if you have worse headaches, vomiting, neck pain, fever

## 2019-02-28 DIAGNOSIS — M9903 Segmental and somatic dysfunction of lumbar region: Secondary | ICD-10-CM | POA: Diagnosis not present

## 2019-02-28 DIAGNOSIS — M531 Cervicobrachial syndrome: Secondary | ICD-10-CM | POA: Diagnosis not present

## 2019-02-28 DIAGNOSIS — M5386 Other specified dorsopathies, lumbar region: Secondary | ICD-10-CM | POA: Diagnosis not present

## 2019-02-28 DIAGNOSIS — M9901 Segmental and somatic dysfunction of cervical region: Secondary | ICD-10-CM | POA: Diagnosis not present

## 2019-03-07 DIAGNOSIS — M9903 Segmental and somatic dysfunction of lumbar region: Secondary | ICD-10-CM | POA: Diagnosis not present

## 2019-03-07 DIAGNOSIS — M5386 Other specified dorsopathies, lumbar region: Secondary | ICD-10-CM | POA: Diagnosis not present

## 2019-03-07 DIAGNOSIS — M9901 Segmental and somatic dysfunction of cervical region: Secondary | ICD-10-CM | POA: Diagnosis not present

## 2019-03-07 DIAGNOSIS — M531 Cervicobrachial syndrome: Secondary | ICD-10-CM | POA: Diagnosis not present

## 2019-09-19 ENCOUNTER — Ambulatory Visit
Admission: EM | Admit: 2019-09-19 | Discharge: 2019-09-19 | Disposition: A | Discharge status: Home / Self Care | Payer: BC Managed Care – PPO | Attending: Physician Assistant | Admitting: Physician Assistant

## 2019-09-19 ENCOUNTER — Ambulatory Visit (INDEPENDENT_AMBULATORY_CARE_PROVIDER_SITE_OTHER): Payer: BC Managed Care – PPO

## 2019-09-19 ENCOUNTER — Other Ambulatory Visit: Payer: Self-pay

## 2019-09-19 DIAGNOSIS — M25562 Pain in left knee: Secondary | ICD-10-CM

## 2019-09-19 DIAGNOSIS — M25561 Pain in right knee: Secondary | ICD-10-CM

## 2019-09-19 NOTE — ED Triage Notes (Signed)
Pt c/o bilateral knee pain for past few months. Pt requesting x-rays on both knees. Pt denies injury.

## 2019-09-19 NOTE — ED Provider Notes (Signed)
[name]    CSN: [id number] Arrival date & time: 09/19/19  1103      History   Chief Complaint Chief Complaint  Patient presents with  . Knee Pain    HPI Hannah Weber is a 28 y.o. female.   The history is provided by the patient. No language interpreter was used.  Knee Pain Location:  Knee Injury: no   Knee location:  L knee and R knee Pain details:    Quality:  Aching   Radiates to:  Does not radiate   Severity:  Moderate   Onset quality:  Gradual   Duration:  2 months   Timing:  Constant   Progression:  Worsening Chronicity:  New Relieved by:  Nothing Worsened by:  Nothing Ineffective treatments:  None tried Associated symptoms: no stiffness   Pt reports she has pain in both knees. Pt thinks it may be from recent weight gain.   Past Medical History:  Diagnosis Date  . Allergy   . Hypertension     Patient Active Problem List   Diagnosis Date Noted  . Ketonuria 01/12/2018  . Achilles tendinitis of both lower extremities 11/09/2017  . Smoker unmotivated to quit 09/07/2015    History reviewed. No pertinent surgical history.  OB History   No obstetric history on file.      Home Medications    Prior to Admission medications   Not on File    Family History Family History  Problem Relation Age of Onset  . Diabetes Mother     Social History Social History   Tobacco Use  . Smoking status: Current Every Day Smoker  . Smokeless tobacco: Never Used  Substance Use Topics  . Alcohol use: Not Currently  . Drug use: Not Currently    Types: Marijuana     Allergies   Apple and Other   Review of Systems Review of Systems  Musculoskeletal: Positive for myalgias. Negative for stiffness.  All other systems reviewed and are negative.    Physical Exam Triage Vital Signs ED Triage Vitals  Enc Vitals Group     BP 09/19/19 1114 132/88     Pulse Rate 09/19/19 1114 92     Resp 09/19/19 1114 16     Temp 09/19/19 1114 98.1  F (36.7 C)     Temp Source 09/19/19 1114 Oral     SpO2 09/19/19 1114 98 %     Weight --      Height --      Head Circumference --      Peak Flow --      Pain Score 09/19/19 1119 5     Pain Loc --      Pain Edu? --      Excl. in [place]? --    No data found.  Updated Vital Signs BP 132/88 (BP Location: Left Arm)   Pulse 92   Temp 98.1 F (36.7 C) (Oral)   Resp 16   LMP 08/16/2019   SpO2 98%   Visual Acuity Right Eye Distance:   Left Eye Distance:   Bilateral Distance:    Right Eye Near:   Left Eye Near:    Bilateral Near:     Physical Exam Vitals and nursing note reviewed.  Constitutional:      Appearance: She is well-developed.  HENT:     Head: Normocephalic.  Abdominal:     General: There is no distension.  Musculoskeletal:  General: No swelling, tenderness or deformity. Normal range of motion.     Right lower leg: No edema.     Left lower leg: No edema.  Skin:    General: Skin is warm.  Neurological:     Mental Status: She is alert and oriented to person, place, and time.      UC Treatments / Results  Labs (all labs ordered are listed, but only abnormal results are displayed) Labs Reviewed - No data to display  EKG   Radiology DG Knee 2 Views Left  Result Date: 09/19/2019 CLINICAL DATA:  Left knee pain. EXAM: LEFT KNEE - 1-2 VIEW COMPARISON:  None. FINDINGS: The joint spaces are maintained. No acute bony findings, degenerative changes or osteochondral lesion. No joint effusion. IMPRESSION: No acute bony findings, degenerative changes or joint effusion. Electronically Signed   By: Marijo Sanes M.D.   On: 09/19/2019 12:07   DG Knee 2 Views Right  Result Date: 09/19/2019 CLINICAL DATA:  28 year old female with bilateral knee pain for 2 months with no known injury. Pain at the patella and tibial tuberosity right greater than left. EXAM: RIGHT KNEE - 1-2 VIEW COMPARISON:  Contralateral left knee series the same day. FINDINGS: No joint effusion is  identified. Joint spaces and alignment are preserved. The patella appears intact. Unremarkable tibial tuberosity. No osseous abnormality identified. IMPRESSION: No joint effusion or osseous abnormality identified at the right knee. Electronically Signed   By: Genevie Ann M.D.   On: 09/19/2019 12:06    Procedures Procedures (including critical care time)  Medications Ordered in UC Medications - No data to display  Initial Impression / Assessment and Plan / UC Course  I have reviewed the triage vital signs and the nursing notes.  Pertinent labs & imaging results that were available during my care of the patient were reviewed by me and considered in my medical decision making (see chart for details).     MDM  Pt counseled on results.  Pt given exercises.  Pt advised to follow up with Orthopaedist if pain persist.  Final Clinical Impressions(s) / UC Diagnoses   Final diagnoses:  Acute pain of both knees   Discharge Instructions   None    ED Prescriptions    None     PDMP not reviewed this encounter.  An After Visit Summary was printed and given to the patient.    Fransico Meadow, [place] 09/19/19 1320

## 2019-09-30 ENCOUNTER — Encounter: Payer: PRIVATE HEALTH INSURANCE | Admitting: Emergency Medicine

## 2019-10-01 ENCOUNTER — Encounter: Payer: Self-pay | Admitting: Emergency Medicine

## 2019-10-06 DIAGNOSIS — F329 Major depressive disorder, single episode, unspecified: Secondary | ICD-10-CM | POA: Diagnosis not present

## 2019-10-28 ENCOUNTER — Ambulatory Visit
Admission: EM | Admit: 2019-10-28 | Discharge: 2019-10-28 | Disposition: A | Discharge status: Home / Self Care | Payer: BC Managed Care – PPO | Attending: Emergency Medicine | Admitting: Emergency Medicine

## 2019-10-28 ENCOUNTER — Other Ambulatory Visit: Payer: Self-pay

## 2019-10-28 ENCOUNTER — Encounter: Payer: Self-pay | Admitting: Emergency Medicine

## 2019-10-28 DIAGNOSIS — R300 Dysuria: Secondary | ICD-10-CM | POA: Diagnosis not present

## 2019-10-28 DIAGNOSIS — Z3202 Encounter for pregnancy test, result negative: Secondary | ICD-10-CM

## 2019-10-28 DIAGNOSIS — R1031 Right lower quadrant pain: Secondary | ICD-10-CM | POA: Diagnosis not present

## 2019-10-28 LAB — POCT URINALYSIS DIP (MANUAL ENTRY)
Bilirubin, UA: NEGATIVE
Glucose, UA: NEGATIVE mg/dL
Ketones, POC UA: NEGATIVE mg/dL
Leukocytes, UA: NEGATIVE
Nitrite, UA: NEGATIVE
Protein Ur, POC: NEGATIVE mg/dL
Spec Grav, UA: 1.025 (ref 1.010–1.025)
Urobilinogen, UA: 0.2 E.U./dL
pH, UA: 7 (ref 5.0–8.0)

## 2019-10-28 LAB — POCT URINE PREGNANCY: Preg Test, Ur: NEGATIVE

## 2019-10-28 NOTE — ED Triage Notes (Signed)
Pt presents to [facility] for assessment of 5-6 days of RLQ pain and burning at the end of urination.

## 2019-10-28 NOTE — Discharge Instructions (Addendum)
Important to follow-up with primary care for further evaluation and management of weight gain, stressors, menstrual irregularities, diabetes screening. May take Ibuprofen, Tylenol OTC for abdominal pain. Important to drink plenty of water: Urine not infected today. Go to ER for worsening pain, painful abdomen with any movement, fever, nausea, vomiting.

## 2019-10-28 NOTE — ED Notes (Signed)
Patient able to ambulate independently  

## 2019-10-28 NOTE — ED Provider Notes (Signed)
[name]    CSN: [id number] Arrival date & time: 10/28/19  1525      History   Chief Complaint Chief Complaint  Patient presents with  . APPT: 330pm  . Dysuria    HPI TRACE HANKES is a 29 y.o. female with history of hypertension presenting for RLQ pain x5 to 6 days.  Patient also notes burning with urination: More so at the end of stream.  Denies history of kidney stone, irregular cycles.  Has tried cranberry juice, water with some relief.  Denies history of appendectomy: States RLQ pain is worse with sitting, bending over, Valsalva.  Denies history of hernia, constipation, diarrhea.  No fever, arthralgias, myalgias, blood in urine, nausea, vomiting, chest pain, cough.  No known sick contacts.   Past Medical History:  Diagnosis Date  . Allergy   . Hypertension     Patient Active Problem List   Diagnosis Date Noted  . Ketonuria 01/12/2018  . Achilles tendinitis of both lower extremities 11/09/2017  . Smoker unmotivated to quit 09/07/2015    History reviewed. No pertinent surgical history.  OB History   No obstetric history on file.      Home Medications    Prior to Admission medications   Not on File    Family History Family History  Problem Relation Age of Onset  . Diabetes Mother     Social History Social History   Tobacco Use  . Smoking status: Current Every Day Smoker  . Smokeless tobacco: Never Used  Substance Use Topics  . Alcohol use: Not Currently  . Drug use: Not Currently    Types: Marijuana     Allergies   Apple and Other   Review of Systems As per HPI   Physical Exam Triage Vital Signs ED Triage Vitals  Enc Vitals Group     BP 10/28/19 1540 (!) 137/92     Pulse Rate 10/28/19 1540 93     Resp 10/28/19 1540 16     Temp 10/28/19 1540 98.3 F (36.8 C)     Temp Source 10/28/19 1540 Oral     SpO2 10/28/19 1540 98 %     Weight --      Height --      Head Circumference --      Peak Flow --      Pain Score  10/28/19 1556 2     Pain Loc --      Pain Edu? --      Excl. in [place]? --    No data found.  Updated Vital Signs BP (!) 137/92 (BP Location: Left Arm)   Pulse 93   Temp 98.3 F (36.8 C) (Oral)   Resp 16   LMP 08/13/2019   SpO2 98%   Visual Acuity Right Eye Distance:   Left Eye Distance:   Bilateral Distance:    Right Eye Near:   Left Eye Near:    Bilateral Near:     Physical Exam Constitutional:      General: She is not in acute distress.    Appearance: She is not ill-appearing or diaphoretic.  HENT:     Head: Normocephalic and atraumatic.  Eyes:     General: No scleral icterus.    Pupils: Pupils are equal, round, and reactive to light.  Cardiovascular:     Rate and Rhythm: Normal rate.     Heart sounds: No murmur. No gallop.   Pulmonary:     Effort: Pulmonary effort is  normal. No respiratory distress.     Breath sounds: No wheezing.  Abdominal:     General: Bowel sounds are normal. There is no distension.     Tenderness: There is abdominal tenderness. There is no right CVA tenderness, left CVA tenderness, guarding or rebound.     Hernia: No hernia is present.     Comments: Mild RLQ tenderness with palpation: No rebound.  Negative Murphy's, McBurney's, Rovsing signs.  Musculoskeletal:     Cervical back: Neck supple. No tenderness.  Lymphadenopathy:     Cervical: No cervical adenopathy.  Skin:    Capillary Refill: Capillary refill takes less than 2 seconds.     Coloration: Skin is not jaundiced or pale.  Neurological:     Mental Status: She is alert and oriented to person, place, and time.      UC Treatments / Results  Labs (all labs ordered are listed, but only abnormal results are displayed) Labs Reviewed  POCT URINALYSIS DIP (MANUAL ENTRY) - Abnormal; Notable for the following components:      Result Value   Blood, UA trace-intact (*)    All other components within normal limits  POCT URINE PREGNANCY    EKG   Radiology No results  found.  Procedures Procedures (including critical care time)  Medications Ordered in UC Medications - No data to display  Initial Impression / Assessment and Plan / UC Course  I have reviewed the triage vital signs and the nursing notes.  Pertinent labs & imaging results that were available during my care of the patient were reviewed by me and considered in my medical decision making (see chart for details).     Patient afebrile, nontoxic.  Urine pregnancy negative, urine dipstick showing trace intact blood-culture deferred.  Encourage patient to push fluids, monitor RLQ pain which could be contributed by gas, constipation, appendiceal irritation: Low concern for acute appendicitis at this time, though discussed return precautions thereof.  Patient verbalized understanding, agreeable to plan. Final Clinical Impressions(s) / UC Diagnoses   Final diagnoses:  RLQ abdominal pain  Dysuria     Discharge Instructions     Important to follow-up with primary care for further evaluation and management of weight gain, stressors, menstrual irregularities, diabetes screening. May take Ibuprofen, Tylenol OTC for abdominal pain. Important to drink plenty of water: Urine not infected today. Go to ER for worsening pain, painful abdomen with any movement, fever, nausea, vomiting.    ED Prescriptions    None     PDMP not reviewed this encounter.   [place], [place], [place] 10/31/19 2000

## 2019-11-03 ENCOUNTER — Telehealth: Payer: Self-pay | Admitting: General Practice

## 2019-11-03 NOTE — Telephone Encounter (Signed)
Called and confirmed patients phone visit tomorrow, 11/04/2019 @ 2:10

## 2019-11-04 ENCOUNTER — Ambulatory Visit (INDEPENDENT_AMBULATORY_CARE_PROVIDER_SITE_OTHER): Payer: BC Managed Care – PPO | Admitting: Internal Medicine

## 2019-11-04 ENCOUNTER — Encounter: Payer: Self-pay | Admitting: Internal Medicine

## 2019-11-04 DIAGNOSIS — Z7689 Persons encountering health services in other specified circumstances: Secondary | ICD-10-CM | POA: Diagnosis not present

## 2019-11-04 NOTE — Progress Notes (Signed)
Virtual Visit via Telephone Note  I connected with Hannah Weber, on 11/04/2019 at 2:13 PM by telephone due to the COVID-19 pandemic and verified that I am speaking with the correct person using two identifiers.   Consent: I discussed the limitations, risks, security and privacy concerns of performing an evaluation and management service by telephone and the availability of in person appointments. I also discussed with the patient that there may be a patient responsible charge related to this service. The patient expressed understanding and agreed to proceed.   Location of Patient: Home   Location of Provider: Clinic    Persons participating in Telemedicine visit: Conda Albu [facility] Dr. Juleen China      History of Present Illness: Patient has an appointment to establish care. Remote history of ADHD. No other past medical history. No current medications.   Was seen at urgent care for flank pain and dysuria. No evidence of infection at this visit and reports all other symptoms went away. She is feeling well now and has no concerns.    Past Medical History:  Diagnosis Date  . Allergy   . Hypertension    Allergies  Allergen Reactions  . Apple   . Other     mangos    No current outpatient medications on file prior to visit.   No current facility-administered medications on file prior to visit.    Observations/Objective: NAD. Speaking clearly.  Work of breathing normal.  Alert and oriented. Mood appropriate.   Assessment and Plan: 1. Encounter to establish care Discussed with patient that she will need an annual exam with PAP. Counseled on importance of cervical cancer screening. She would like to have blood work checked as well.   Follow Up Instructions: Schedule annual exam    I discussed the assessment and treatment plan with the patient. The patient was provided an opportunity to ask questions and all were answered. The patient agreed with the plan  and demonstrated an understanding of the instructions.   The patient was advised to call back or seek an in-person evaluation if the symptoms worsen or if the condition fails to improve as anticipated.     I provided 10 minutes total of non-face-to-face time during this encounter including median intraservice time, reviewing previous notes, investigations, ordering medications, medical decision making, coordinating care and patient verbalized understanding at the end of the visit.    Phill Myron, D.O. Primary Care at [facility]  11/04/2019, 2:13 PM

## 2019-11-25 ENCOUNTER — Telehealth: Payer: Self-pay

## 2019-11-25 NOTE — Telephone Encounter (Signed)
Called patient to do their pre-visit COVID screening.  Call went to voicemail. Unable to do prescreening.  

## 2019-11-26 ENCOUNTER — Ambulatory Visit (INDEPENDENT_AMBULATORY_CARE_PROVIDER_SITE_OTHER): Payer: BC Managed Care – PPO | Admitting: Internal Medicine

## 2019-11-26 DIAGNOSIS — Z5329 Procedure and treatment not carried out because of patient's decision for other reasons: Secondary | ICD-10-CM

## 2019-11-27 NOTE — Progress Notes (Signed)
Patient was a no show. Opened in error.

## 2019-12-05 LAB — HM PAP SMEAR: HM Pap smear: NORMAL

## 2019-12-18 DIAGNOSIS — Z1329 Encounter for screening for other suspected endocrine disorder: Secondary | ICD-10-CM | POA: Diagnosis not present

## 2019-12-18 DIAGNOSIS — Z130 Encounter for screening for diseases of the blood and blood-forming organs and certain disorders involving the immune mechanism: Secondary | ICD-10-CM | POA: Diagnosis not present

## 2019-12-18 DIAGNOSIS — Z01419 Encounter for gynecological examination (general) (routine) without abnormal findings: Secondary | ICD-10-CM | POA: Diagnosis not present

## 2019-12-18 DIAGNOSIS — Z0000 Encounter for general adult medical examination without abnormal findings: Secondary | ICD-10-CM | POA: Diagnosis not present

## 2019-12-18 DIAGNOSIS — Z113 Encounter for screening for infections with a predominantly sexual mode of transmission: Secondary | ICD-10-CM | POA: Diagnosis not present

## 2019-12-18 DIAGNOSIS — Z1159 Encounter for screening for other viral diseases: Secondary | ICD-10-CM | POA: Diagnosis not present

## 2019-12-18 DIAGNOSIS — Z1635 Resistance to multiple antimicrobial drugs: Secondary | ICD-10-CM | POA: Diagnosis not present

## 2019-12-18 DIAGNOSIS — Z13220 Encounter for screening for lipoid disorders: Secondary | ICD-10-CM | POA: Diagnosis not present

## 2019-12-18 DIAGNOSIS — Z114 Encounter for screening for human immunodeficiency virus [HIV]: Secondary | ICD-10-CM | POA: Diagnosis not present

## 2019-12-18 DIAGNOSIS — Z131 Encounter for screening for diabetes mellitus: Secondary | ICD-10-CM | POA: Diagnosis not present

## 2019-12-18 DIAGNOSIS — R109 Unspecified abdominal pain: Secondary | ICD-10-CM | POA: Diagnosis not present

## 2019-12-18 DIAGNOSIS — N926 Irregular menstruation, unspecified: Secondary | ICD-10-CM | POA: Diagnosis not present

## 2019-12-27 ENCOUNTER — Ambulatory Visit
Admission: EM | Admit: 2019-12-27 | Discharge: 2019-12-27 | Disposition: A | Discharge status: Home / Self Care | Payer: BC Managed Care – PPO | Attending: Physician Assistant | Admitting: Physician Assistant

## 2019-12-27 ENCOUNTER — Encounter: Payer: Self-pay | Admitting: Physician Assistant

## 2019-12-27 ENCOUNTER — Other Ambulatory Visit: Payer: Self-pay

## 2019-12-27 DIAGNOSIS — M940 Chondrocostal junction syndrome [Tietze]: Secondary | ICD-10-CM

## 2019-12-27 MED ORDER — NAPROXEN 500 MG PO TABS: 500.0000 mg | ORAL_TABLET | Freq: Two times a day (BID) | ORAL | 0 refills | Status: AC

## 2019-12-27 NOTE — Discharge Instructions (Addendum)
You appear to have rib or sternal inflammation given the reproducible pain. Warm compresses and continued topicals will help. Take the Naprosyn twice a day with food for 1-2 weeks. If you are worsening or symtpoms become emergent then please go to the ED/ Otherwise if continues then f/u with your PCP.

## 2019-12-27 NOTE — ED Notes (Signed)
Patient able to ambulate independently  

## 2019-12-27 NOTE — ED Triage Notes (Signed)
Pt presents to [facility] for assessment of mid sternal chest pain and left axillary pain.  Patient states depending on how she moves the pain becomes worse.  Denies n/v, denies light-headedness, denies dizziness, denies neck pain and jaw pain.

## 2019-12-27 NOTE — ED Provider Notes (Signed)
[name]    CSN: [id number] Arrival date & time: 12/27/19  0944      History   Chief Complaint No chief complaint on file.   HPI Hannah Weber is a 29 y.o. female.   Who is otherwise healthy presents with sternal pain that radiates into left axilla. Pain x 3 days without previous injury. She notes discomfort then raising her left arm at times and ROM. She denies diaphoresis, N, V or dyspnea. No prior history of cardiac events. No GI symptoms are noted. She has not tried any remedies for this.      Past Medical History:  Diagnosis Date  . Allergy   . Hypertension     Patient Active Problem List   Diagnosis Date Noted  . Ketonuria 01/12/2018  . Achilles tendinitis of both lower extremities 11/09/2017  . Smoker unmotivated to quit 09/07/2015    No past surgical history on file.  OB History   No obstetric history on file.      Home Medications    Prior to Admission medications   Not on File    Family History Family History  Problem Relation Age of Onset  . Diabetes Mother     Social History Social History   Tobacco Use  . Smoking status: Current Every Day Smoker  . Smokeless tobacco: Never Used  Substance Use Topics  . Alcohol use: Not Currently  . Drug use: Not Currently    Types: Marijuana     Allergies   Apple and Other   Review of Systems Review of Systems  Constitutional: Negative for chills, fatigue and fever.  HENT: Negative.   Respiratory: Negative for cough and shortness of breath.   Cardiovascular: Positive for chest pain.  Gastrointestinal: Negative.  Negative for abdominal pain, diarrhea, nausea and vomiting.  Genitourinary: Negative for flank pain.  Musculoskeletal: Negative for arthralgias, back pain and myalgias.  Skin: Negative.   Allergic/Immunologic: Negative.   Neurological: Negative for dizziness.     Physical Exam Triage Vital Signs ED Triage Vitals  Enc Vitals Group     BP      Pulse      Resp      Temp      Temp src      SpO2      Weight      Height      Head Circumference      Peak Flow      Pain Score      Pain Loc      Pain Edu?      Excl. in [place]?    No data found.  Updated Vital Signs There were no vitals taken for this visit.  Visual Acuity Right Eye Distance:   Left Eye Distance:   Bilateral Distance:    Right Eye Near:   Left Eye Near:    Bilateral Near:     Physical Exam Vitals and nursing note reviewed.  Constitutional:      General: She is not in acute distress.    Appearance: She is well-developed and normal weight. She is not ill-appearing, toxic-appearing or diaphoretic.  HENT:     Head: Normocephalic and atraumatic.  Cardiovascular:     Rate and Rhythm: Normal rate and regular rhythm.     Heart sounds: Normal heart sounds.  Pulmonary:     Breath sounds: Normal breath sounds.  Chest:     Chest wall: Tenderness present. No mass.     Comments:  Reproducible pain to mid sternum and left axilla region without swelling or lesions or rash Musculoskeletal:        General: Normal range of motion.  Skin:    General: Skin is warm and dry.     Findings: No rash.  Neurological:     General: No focal deficit present.     Mental Status: She is alert.  Psychiatric:        Mood and Affect: Mood normal.        Behavior: Behavior normal.      UC Treatments / Results  Labs (all labs ordered are listed, but only abnormal results are displayed) Labs Reviewed - No data to display  EKG   Radiology No results found.  Procedures Procedures (including critical care time)  Medications Ordered in UC Medications - No data to display  Initial Impression / Assessment and Plan / UC Course  I have reviewed the triage vital signs and the nursing notes.  Pertinent labs & imaging results that were available during my care of the patient were reviewed by me and considered in my medical decision making (see chart for details).   Reproducible  chest pain in an otherwise healthy adult without additional cardiac symptoms. Treat with NSAID's, topicals and compresses. IF worsens or symptoms become emergent then f/u with ED. If not improving then f/u with PCP.   Final Clinical Impressions(s) / UC Diagnoses   Final diagnoses:  None   Discharge Instructions   None    ED Prescriptions    None     PDMP not reviewed this encounter.   Bjorn Pippin, PA-C 12/27/19 1022

## 2020-01-02 ENCOUNTER — Other Ambulatory Visit: Payer: Self-pay

## 2020-01-02 ENCOUNTER — Encounter (HOSPITAL_COMMUNITY): Payer: Self-pay

## 2020-01-02 ENCOUNTER — Emergency Department (HOSPITAL_COMMUNITY)
Admission: EM | Admit: 2020-01-02 | Discharge: 2020-01-02 | Disposition: A | Discharge status: Home / Self Care | Payer: BC Managed Care – PPO | Attending: Emergency Medicine | Admitting: Emergency Medicine

## 2020-01-02 DIAGNOSIS — M531 Cervicobrachial syndrome: Secondary | ICD-10-CM | POA: Diagnosis not present

## 2020-01-02 DIAGNOSIS — M9902 Segmental and somatic dysfunction of thoracic region: Secondary | ICD-10-CM | POA: Diagnosis not present

## 2020-01-02 DIAGNOSIS — M9901 Segmental and somatic dysfunction of cervical region: Secondary | ICD-10-CM | POA: Diagnosis not present

## 2020-01-02 DIAGNOSIS — I10 Essential (primary) hypertension: Secondary | ICD-10-CM | POA: Diagnosis not present

## 2020-01-02 DIAGNOSIS — F17210 Nicotine dependence, cigarettes, uncomplicated: Secondary | ICD-10-CM | POA: Diagnosis not present

## 2020-01-02 DIAGNOSIS — M542 Cervicalgia: Secondary | ICD-10-CM | POA: Diagnosis not present

## 2020-01-02 DIAGNOSIS — M62830 Muscle spasm of back: Secondary | ICD-10-CM | POA: Diagnosis not present

## 2020-01-02 MED ORDER — CYCLOBENZAPRINE HCL 10 MG PO TABS: 10.0000 mg | ORAL_TABLET | Freq: Two times a day (BID) | ORAL | 0 refills | Status: DC | PRN

## 2020-01-02 NOTE — Discharge Instructions (Addendum)
Apply warm compresses to the area of soreness as directed. Continue Naproxen and take Flexeril as prescribed for pain relief.   It is recommended that you become established with a primary care provider for recheck of your symptoms and other routine medical concerns.   Return to the emergency department for any new or worsening symptoms.

## 2020-01-02 NOTE — ED Provider Notes (Signed)
[facility] DEPT Provider Note   CSN: [id number] Arrival date & time: 01/02/20  0416     History Chief Complaint  Patient presents with  . Neck Pain    Hannah Weber is a 29 y.o. female.  Patient to ED for evaluation of left sided neck and shoulder pain. No known injury. She was sitting at her desk when symptoms started. She reports constant pain that is worse when lifting her head. No radiating pain, numbness, weakness of the extremity. She is currently taking Naproxen for a diagnosis of costochondritis and hasn't taken anything else for symptoms of neck pain. No SOB, fever, cough, headache.   The history is provided by the patient. No language interpreter was used.  Neck Pain Associated symptoms: no chest pain, no fever, no numbness and no weakness        Past Medical History:  Diagnosis Date  . Allergy   . Hypertension     Patient Active Problem List   Diagnosis Date Noted  . Ketonuria 01/12/2018  . Achilles tendinitis of both lower extremities 11/09/2017  . Smoker unmotivated to quit 09/07/2015    History reviewed. No pertinent surgical history.   OB History   No obstetric history on file.     Family History  Problem Relation Age of Onset  . Diabetes Mother     Social History   Tobacco Use  . Smoking status: Current Every Day Smoker    Packs/day: 0.25  . Smokeless tobacco: Never Used  Substance Use Topics  . Alcohol use: Not Currently  . Drug use: Not Currently    Types: Marijuana    Home Medications Prior to Admission medications   Medication Sig Start Date End Date Taking? Authorizing Provider  naproxen (NAPROSYN) 500 MG tablet Take 1 tablet (500 mg total) by mouth 2 (two) times daily with a meal for 14 days. 12/27/19 01/10/20  Bjorn Pippin, PA-C    Allergies    Apple and Other  Review of Systems   Review of Systems  Constitutional: Negative for fever.  Respiratory: Negative.  Negative for shortness of  breath.   Cardiovascular: Negative.  Negative for chest pain.  Gastrointestinal: Negative for nausea.  Musculoskeletal: Positive for neck pain.       See HPI.  Skin: Negative.   Neurological: Negative.  Negative for weakness and numbness.    Physical Exam Updated Vital Signs BP (!) 151/101 (BP Location: Left Arm)   Pulse 99   Temp 98.1 F (36.7 C) (Oral)   Resp 17   Ht 5\' 8"  (1.727 m)   Wt 104.3 kg   LMP 12/08/2019   SpO2 98%   BMI 34.97 kg/m   Physical Exam Vitals and nursing note reviewed.  Constitutional:      Appearance: She is well-developed.  Pulmonary:     Effort: Pulmonary effort is normal.  Musculoskeletal:     Cervical back: Normal range of motion.     Comments: No midline cervical tenderness. FROM bilateral UE's without strength deficits. Distal pulses present in UE's. FROM neck without limitation.   Skin:    General: Skin is warm and dry.  Neurological:     Mental Status: She is alert and oriented to person, place, and time.     ED Results / Procedures / Treatments   Labs (all labs ordered are listed, but only abnormal results are displayed) Labs Reviewed - No data to display  EKG None  Radiology No results found.  Procedures Procedures (including critical care time)  Medications Ordered in ED Medications - No data to display  ED Course  I have reviewed the triage vital signs and the nursing notes.  Pertinent labs & imaging results that were available during my care of the patient were reviewed by me and considered in my medical decision making (see chart for details).    MDM Rules/Calculators/A&P                      Patient to ED with left neck pain x 2-3 days.   Do not feel symptoms are radicular. No evidence of infection. No vascular compromise. Will encourage her to continue Naproxen and will add Flexeril.   The patient is strongly encouraged to become established with a primary care provider for recheck of neck pain.  Return  precautions discussed.   Final Clinical Impression(s) / ED Diagnoses Final diagnoses:  Neck pain    Rx / DC Orders ED Discharge Orders    None       Dennie Bible 01/02/20 0543    Palumbo, April, MD 01/02/20 0600

## 2020-01-02 NOTE — ED Triage Notes (Signed)
Patient arrived with complaints of left sided neck pain that suddenly started while sitting at her desk on Wednesday. Denies any other symptoms or injury.

## 2020-03-10 DIAGNOSIS — H1045 Other chronic allergic conjunctivitis: Secondary | ICD-10-CM | POA: Diagnosis not present

## 2020-03-13 ENCOUNTER — Ambulatory Visit
Admission: EM | Admit: 2020-03-13 | Discharge: 2020-03-13 | Disposition: A | Discharge status: Home / Self Care | Payer: BC Managed Care – PPO | Attending: Physician Assistant | Admitting: Physician Assistant

## 2020-03-13 DIAGNOSIS — R0789 Other chest pain: Secondary | ICD-10-CM

## 2020-03-13 DIAGNOSIS — R079 Chest pain, unspecified: Secondary | ICD-10-CM

## 2020-03-13 MED ORDER — NAPROXEN 500 MG PO TABS: 500.0000 mg | ORAL_TABLET | Freq: Two times a day (BID) | ORAL | 0 refills | Status: DC

## 2020-03-13 MED ORDER — FAMOTIDINE 20 MG PO TABS: 20.0000 mg | ORAL_TABLET | Freq: Two times a day (BID) | ORAL | 0 refills | Status: DC

## 2020-03-13 MED ORDER — TIZANIDINE HCL 2 MG PO TABS: 2.0000 mg | ORAL_TABLET | Freq: Three times a day (TID) | ORAL | 0 refills | Status: DC | PRN

## 2020-03-13 NOTE — ED Provider Notes (Signed)
[name]    CSN: [id number] Arrival date & time: 03/13/20  1004      History   Chief Complaint Chief Complaint  Patient presents with  . Chest Pain    sternal discomfort    HPI Hannah Weber is a 29 y.o. female.   29 year old female comes in for recurrent sternal pain, right chest pain for the past few months. Denies worsening pain. Pain is intermittent, without obvious aggravating or alleviating factor. Occasionally will feel numbness/radiation of symptoms to the right or left upper extremity. Denies joint swelling, erythema. Denies loss of grip strength. Denies shortness of breath. Denies nausea/vomiting/abdominal pain. Patient has been treated for costochondritis in the past and feels the same. RHD. Denies any increased in activity, injury/trauma.      Past Medical History:  Diagnosis Date  . Allergy   . Hypertension     Patient Active Problem List   Diagnosis Date Noted  . Ketonuria 01/12/2018  . Achilles tendinitis of both lower extremities 11/09/2017  . Smoker unmotivated to quit 09/07/2015    History reviewed. No pertinent surgical history.  OB History   No obstetric history on file.      Home Medications    Prior to Admission medications   Medication Sig Start Date End Date Taking? Authorizing Provider  famotidine (PEPCID) 20 MG tablet Take 1 tablet (20 mg total) by mouth 2 (two) times daily. 03/13/20   Tasia Catchings, Jrake Rodriquez V, PA-C  naproxen (NAPROSYN) 500 MG tablet Take 1 tablet (500 mg total) by mouth 2 (two) times daily. 03/13/20   Tasia Catchings, Skipper Dacosta V, PA-C  tiZANidine (ZANAFLEX) 2 MG tablet Take 1 tablet (2 mg total) by mouth every 8 (eight) hours as needed for muscle spasms. 03/13/20   Ok Edwards, PA-C    Family History Family History  Problem Relation Age of Onset  . Diabetes Mother     Social History Social History   Tobacco Use  . Smoking status: Current Every Day Smoker    Packs/day: 0.25  . Smokeless tobacco: Never Used  Vaping Use  .  Vaping Use: Never used  Substance Use Topics  . Alcohol use: Not Currently  . Drug use: Not Currently    Types: Marijuana     Allergies   Apple and Other   Review of Systems Review of Systems  Reason unable to perform ROS: See HPI as above.     Physical Exam Triage Vital Signs ED Triage Vitals  Enc Vitals Group     BP 03/13/20 1107 115/76     Pulse Rate 03/13/20 1107 82     Resp 03/13/20 1107 15     Temp 03/13/20 1107 98.2 F (36.8 C)     Temp Source 03/13/20 1107 Oral     SpO2 03/13/20 1107 98 %     Weight --      Height --      Head Circumference --      Peak Flow --      Pain Score 03/13/20 1104 4     Pain Loc --      Pain Edu? --      Excl. in [place]? --    No data found.  Updated Vital Signs BP 115/76 (BP Location: Left Arm)   Pulse 82   Temp 98.2 F (36.8 C) (Oral)   Resp 15   LMP 03/09/2020 (Approximate)   SpO2 98%   Visual Acuity Right Eye Distance:   Left Eye  Distance:   Bilateral Distance:    Right Eye Near:   Left Eye Near:    Bilateral Near:     Physical Exam Constitutional:      General: She is not in acute distress.    Appearance: She is well-developed. She is not diaphoretic.  HENT:     Head: Normocephalic and atraumatic.  Eyes:     Conjunctiva/sclera: Conjunctivae normal.     Pupils: Pupils are equal, round, and reactive to light.  Cardiovascular:     Rate and Rhythm: Normal rate and regular rhythm.     Heart sounds: Normal heart sounds. No murmur heard.  No friction rub. No gallop.   Pulmonary:     Effort: Pulmonary effort is normal. No accessory muscle usage or respiratory distress.     Breath sounds: Normal breath sounds. No stridor. No decreased breath sounds, wheezing, rhonchi or rales.  Chest:     Comments: Patient points to mid sternum/right chest for symptoms, but not reproducible on exam. Abdominal:     General: Abdomen is flat. Bowel sounds are normal.     Palpations: Abdomen is soft.     Tenderness: There is no  abdominal tenderness. There is no guarding or rebound.  Musculoskeletal:     Comments: No tenderness to palpation of the neck/shoulders. Full ROM of BUE. Strength 5/5. Sensation intact. Radial pulse 2+  Skin:    General: Skin is warm and dry.  Neurological:     Mental Status: She is alert and oriented to person, place, and time.      UC Treatments / Results  Labs (all labs ordered are listed, but only abnormal results are displayed) Labs Reviewed - No data to display  EKG   Radiology No results found.  Procedures Procedures (including critical care time)  Medications Ordered in UC Medications - No data to display  Initial Impression / Assessment and Plan / UC Course  I have reviewed the triage vital signs and the nursing notes.  Pertinent labs & imaging results that were available during my care of the patient were reviewed by me and considered in my medical decision making (see chart for details).    No pain reproducible on exam.  Patient has been treated for costochondritis in the past with good relief on medicine, and does feel the same.  Also states symptoms could be acid reflux, though no obvious burning, nausea, vomiting.  Chest pain without obvious aggravating or alleviating factor.  Will trial Pepcid for possible acid reflux, and add naproxen/tizanidine as needed if symptoms not improving.  Patient to follow-up with PCP for further evaluation needed.  Return precautions given.  Final Clinical Impressions(s) / UC Diagnoses   Final diagnoses:  Midsternal chest pain  Right-sided chest pain   ED Prescriptions    Medication Sig Dispense Auth. Provider   famotidine (PEPCID) 20 MG tablet Take 1 tablet (20 mg total) by mouth 2 (two) times daily. 30 tablet Marchella Hibbard V, PA-C   tiZANidine (ZANAFLEX) 2 MG tablet Take 1 tablet (2 mg total) by mouth every 8 (eight) hours as needed for muscle spasms. 15 tablet Jayme Cham V, PA-C   naproxen (NAPROSYN) 500 MG tablet Take 1 tablet (500  mg total) by mouth 2 (two) times daily. 30 tablet Ok Edwards, PA-C     PDMP not reviewed this encounter.   Ok Edwards, PA-C 03/13/20 1151

## 2020-03-13 NOTE — ED Triage Notes (Signed)
Patient is here with complaints of recurring discomfort in the sternal area. She states "feels like reflux or a pulled muscle". Ongoing x one month.

## 2020-03-13 NOTE — Discharge Instructions (Addendum)
As discussed, given no pain on palpation, may not be the muscles causing symptoms. Start pepcid as directed for possible acid reflux. If symptoms not improving, start naproxen and tizanidine as needed. Follow up with PCP for further evaluation if symptoms recurring. If sudden significant worsening of chest pain, shortness of breath, go to the ED for further evaluation.

## 2020-04-02 ENCOUNTER — Other Ambulatory Visit: Payer: Self-pay

## 2020-04-02 ENCOUNTER — Ambulatory Visit
Admission: EM | Admit: 2020-04-02 | Discharge: 2020-04-02 | Disposition: A | Discharge status: Home / Self Care | Payer: BC Managed Care – PPO | Attending: Emergency Medicine | Admitting: Emergency Medicine

## 2020-04-02 ENCOUNTER — Encounter: Payer: Self-pay | Admitting: Emergency Medicine

## 2020-04-02 DIAGNOSIS — J301 Allergic rhinitis due to pollen: Secondary | ICD-10-CM

## 2020-04-02 MED ORDER — CETIRIZINE HCL 10 MG PO TABS: 10.0000 mg | ORAL_TABLET | Freq: Every day | ORAL | 0 refills | Status: DC

## 2020-04-02 MED ORDER — FLUTICASONE PROPIONATE 50 MCG/ACT NA SUSP: 1.0000 | Freq: Every day | NASAL | 0 refills | Status: DC

## 2020-04-02 NOTE — ED Triage Notes (Signed)
Pt presents to [facility] for assessment of nasal congestion, sinus pressure, a feeling "like there's something in my throat" (denies pain) and post-nasal drip x 1 week.

## 2020-04-02 NOTE — ED Provider Notes (Signed)
[name]    CSN: [id number] Arrival date & time: 04/02/20  1532      History   Chief Complaint Chief Complaint  Patient presents with  . URI    HPI Hannah Weber is a 29 y.o. female with history of allergies, hypertension presenting for week course of URI symptoms.  Patient Dorsey nasal congestion, sinus pressure, throat irritation, postnasal drip.  Has not take anything for this.  No cough, chest pain, difficulty breathing, fever, arthralgias, myalgias.  No known sick contacts.     Past Medical History:  Diagnosis Date  . Allergy   . Hypertension     Patient Active Problem List   Diagnosis Date Noted  . Ketonuria 01/12/2018  . Achilles tendinitis of both lower extremities 11/09/2017  . Smoker unmotivated to quit 09/07/2015    History reviewed. No pertinent surgical history.  OB History   No obstetric history on file.      Home Medications    Prior to Admission medications   Medication Sig Start Date End Date Taking? Authorizing Provider  cetirizine (ZYRTEC ALLERGY) 10 MG tablet Take 1 tablet (10 mg total) by mouth daily. 04/02/20   Hall-Potvin, [place], PA-C  famotidine (PEPCID) 20 MG tablet Take 1 tablet (20 mg total) by mouth 2 (two) times daily. Patient taking differently: Take 20 mg by mouth 2 (two) times daily. Patient uses as needed 03/13/20   Tasia Catchings, Amy V, PA-C  fluticasone (FLONASE) 50 MCG/ACT nasal spray Place 1 spray into both nostrils daily. 04/02/20   Hall-Potvin, [place], PA-C  naproxen (NAPROSYN) 500 MG tablet Take 1 tablet (500 mg total) by mouth 2 (two) times daily. 03/13/20   Tasia Catchings, Amy V, PA-C  tiZANidine (ZANAFLEX) 2 MG tablet Take 1 tablet (2 mg total) by mouth every 8 (eight) hours as needed for muscle spasms. 03/13/20   Ok Edwards, PA-C    Family History Family History  Problem Relation Age of Onset  . Diabetes Mother     Social History Social History   Tobacco Use  . Smoking status: Current Every Day Smoker    Packs/day:  0.10  . Smokeless tobacco: Never Used  Vaping Use  . Vaping Use: Never used  Substance Use Topics  . Alcohol use: Not Currently  . Drug use: Not Currently    Types: Marijuana     Allergies   Apple and Other   Review of Systems As per HPI   Physical Exam Triage Vital Signs ED Triage Vitals  Enc Vitals Group     BP      Pulse      Resp      Temp      Temp src      SpO2      Weight      Height      Head Circumference      Peak Flow      Pain Score      Pain Loc      Pain Edu?      Excl. in [place]?    No data found.  Updated Vital Signs BP (!) 144/84 (BP Location: Left Arm)   Pulse 78   Temp 98.9 F (37.2 C) (Oral)   Resp 16   LMP 03/09/2020 (Approximate)   SpO2 98%   Visual Acuity Right Eye Distance:   Left Eye Distance:   Bilateral Distance:    Right Eye Near:   Left Eye Near:    Bilateral Near:  Physical Exam Constitutional:      General: She is not in acute distress.    Appearance: She is obese. She is not ill-appearing or diaphoretic.  HENT:     Head: Normocephalic and atraumatic.     Nose:     Comments: Bilateral turbinate edema (R>L) with mucosal pallor.    Mouth/Throat:     Mouth: Mucous membranes are moist.     Pharynx: Oropharynx is clear. No oropharyngeal exudate or posterior oropharyngeal erythema.  Eyes:     General: No scleral icterus.    Conjunctiva/sclera: Conjunctivae normal.     Pupils: Pupils are equal, round, and reactive to light.  Neck:     Comments: Trachea midline, negative JVD Cardiovascular:     Rate and Rhythm: Normal rate and regular rhythm.     Heart sounds: No murmur heard.  No gallop.   Pulmonary:     Effort: Pulmonary effort is normal. No respiratory distress.     Breath sounds: No wheezing, rhonchi or rales.  Musculoskeletal:     Cervical back: Neck supple. No tenderness.  Lymphadenopathy:     Cervical: No cervical adenopathy.  Skin:    Capillary Refill: Capillary refill takes less than 2 seconds.      Coloration: Skin is not jaundiced or pale.     Findings: No rash.  Neurological:     General: No focal deficit present.     Mental Status: She is alert and oriented to person, place, and time.      UC Treatments / Results  Labs (all labs ordered are listed, but only abnormal results are displayed) Labs Reviewed - No data to display  EKG   Radiology No results found.  Procedures Procedures (including critical care time)  Medications Ordered in UC Medications - No data to display  Initial Impression / Assessment and Plan / UC Course  I have reviewed the triage vital signs and the nursing notes.  Pertinent labs & imaging results that were available during my care of the patient were reviewed by me and considered in my medical decision making (see chart for details).     Patient afebrile, nontoxic, with SpO2 98%.  Declined Covid testing.  Likely seasonal allergies: We will treat supportively as outlined below.  Return precautions discussed, patient verbalized understanding and is agreeable to plan. Final Clinical Impressions(s) / UC Diagnoses   Final diagnoses:  Allergic rhinitis due to pollen, unspecified seasonality     Discharge Instructions     Start flonase, atrovent nasal spray for nasal congestion/drainage. You can use over the counter nasal saline rinse such as neti pot for nasal congestion. Keep hydrated, your urine should be clear to pale yellow in color. Tylenol/motrin for fever and pain. Monitor for any worsening of symptoms, chest pain, shortness of breath, wheezing, swelling of the throat, go to the emergency department for further evaluation needed.     ED Prescriptions    Medication Sig Dispense Auth. Provider   cetirizine (ZYRTEC ALLERGY) 10 MG tablet Take 1 tablet (10 mg total) by mouth daily. 30 tablet Hall-Potvin, [place], PA-C   fluticasone (FLONASE) 50 MCG/ACT nasal spray Place 1 spray into both nostrils daily. 16 g Hall-Potvin, [place], PA-C       PDMP not reviewed this encounter.   Hall-Potvin, [place], [place] 04/03/20 [phone]

## 2020-04-02 NOTE — Discharge Instructions (Signed)
Start flonase, atrovent nasal spray for nasal congestion/drainage. You can use over the counter nasal saline rinse such as neti pot for nasal congestion. Keep hydrated, your urine should be clear to pale yellow in color. Tylenol/motrin for fever and pain. Monitor for any worsening of symptoms, chest pain, shortness of breath, wheezing, swelling of the throat, go to the emergency department for further evaluation needed.  

## 2020-04-02 NOTE — ED Notes (Signed)
Patient able to ambulate independently  

## 2020-05-03 ENCOUNTER — Ambulatory Visit
Admission: EM | Admit: 2020-05-03 | Discharge: 2020-05-03 | Disposition: A | Discharge status: Home / Self Care | Payer: BC Managed Care – PPO

## 2020-05-03 DIAGNOSIS — M79674 Pain in right toe(s): Secondary | ICD-10-CM | POA: Diagnosis not present

## 2020-05-03 NOTE — ED Provider Notes (Signed)
[name]    CSN: [id number] Arrival date & time: 05/03/20  0859      History   Chief Complaint Chief Complaint  Patient presents with  . Toe Pain    HPI Hannah Weber is a 29 y.o. female.   29 year old female comes in for right great toe pain x 1 week. Denies injury. States had irritation to the area before and was able to resolve by trimming to toe nail. However, now with redness and swelling to the area. Denies drainage, warmth.      Past Medical History:  Diagnosis Date  . Allergy     Patient Active Problem List   Diagnosis Date Noted  . Ketonuria 01/12/2018  . Achilles tendinitis of both lower extremities 11/09/2017  . Smoker unmotivated to quit 09/07/2015    History reviewed. No pertinent surgical history.  OB History   No obstetric history on file.      Home Medications    Prior to Admission medications   Medication Sig Start Date End Date Taking? Authorizing Provider  cetirizine (ZYRTEC ALLERGY) 10 MG tablet Take 1 tablet (10 mg total) by mouth daily. 04/02/20   Hall-Potvin, [place], PA-C  famotidine (PEPCID) 20 MG tablet Take 1 tablet (20 mg total) by mouth 2 (two) times daily. Patient taking differently: Take 20 mg by mouth 2 (two) times daily. Patient uses as needed 03/13/20   Tasia Catchings, Briea Mcenery V, PA-C  fluticasone (FLONASE) 50 MCG/ACT nasal spray Place 1 spray into both nostrils daily. 04/02/20   Hall-Potvin, [place], PA-C  naproxen (NAPROSYN) 500 MG tablet Take 1 tablet (500 mg total) by mouth 2 (two) times daily. 03/13/20   Tasia Catchings, Shyna Duignan V, PA-C  tiZANidine (ZANAFLEX) 2 MG tablet Take 1 tablet (2 mg total) by mouth every 8 (eight) hours as needed for muscle spasms. 03/13/20   Ok Edwards, PA-C    Family History Family History  Problem Relation Age of Onset  . Diabetes Mother     Social History Social History   Tobacco Use  . Smoking status: Current Every Day Smoker    Packs/day: 0.10  . Smokeless tobacco: Never Used  Vaping Use  . Vaping  Use: Never used  Substance Use Topics  . Alcohol use: Yes    Comment: occ  . Drug use: Not Currently    Types: Marijuana     Allergies   Apple and Other   Review of Systems Review of Systems  Reason unable to perform ROS: See HPI as above.     Physical Exam Triage Vital Signs ED Triage Vitals [05/03/20 0910]  Enc Vitals Group     BP (!) 164/94     Pulse Rate 92     Resp 18     Temp 98.1 F (36.7 C)     Temp Source Oral     SpO2 98 %     Weight      Height      Head Circumference      Peak Flow      Pain Score 4     Pain Loc      Pain Edu?      Excl. in [place]?    No data found.  Updated Vital Signs BP (!) 164/94 (BP Location: Left Arm)   Pulse 92   Temp 98.1 F (36.7 C) (Oral)   Resp 18   LMP 04/19/2020   SpO2 98%   Physical Exam Constitutional:  General: She is not in acute distress.    Appearance: Normal appearance. She is well-developed. She is not toxic-appearing or diaphoretic.  HENT:     Head: Normocephalic and atraumatic.  Eyes:     Conjunctiva/sclera: Conjunctivae normal.     Pupils: Pupils are equal, round, and reactive to light.  Pulmonary:     Effort: Pulmonary effort is normal. No respiratory distress.  Musculoskeletal:     Cervical back: Normal range of motion and neck supple.       Feet:  Skin:    General: Skin is warm and dry.  Neurological:     Mental Status: She is alert and oriented to person, place, and time.      UC Treatments / Results  Labs (all labs ordered are listed, but only abnormal results are displayed) Labs Reviewed - No data to display  EKG   Radiology No results found.  Procedures Procedures (including critical care time)  Medications Ordered in UC Medications - No data to display  Initial Impression / Assessment and Plan / UC Course  I have reviewed the triage vital signs and the nursing notes.  Pertinent labs & imaging results that were available during my care of the patient were reviewed  by me and considered in my medical decision making (see chart for details).    No indication of toe nail removal at this time. Warm soaks and avoid tight shoes. Return precautions given. Patient expresses understanding and agrees to plan.  Final Clinical Impressions(s) / UC Diagnoses   Final diagnoses:  Pain of right great toe    ED Prescriptions    None     PDMP not reviewed this encounter.   Ok Edwards, PA-C 05/03/20 [phone]

## 2020-05-03 NOTE — ED Triage Notes (Signed)
Pt c/o ingrown toe nail to rt great toe x1wk. Denies injury. Redness noted with no drainage at this time.

## 2020-05-03 NOTE — Discharge Instructions (Signed)
Warm soaks 10-15 mins at a time, 2-3 times a day. Avoid tight shoes. Monitor for spreading redness, warmth, increased swelling, follow up for reevaluation.

## 2020-05-04 ENCOUNTER — Ambulatory Visit: Payer: PRIVATE HEALTH INSURANCE | Admitting: Podiatry

## 2020-05-11 ENCOUNTER — Ambulatory Visit: Payer: BC Managed Care – PPO | Admitting: Podiatry

## 2020-05-19 ENCOUNTER — Ambulatory Visit: Payer: BC Managed Care – PPO | Admitting: Podiatry

## 2020-06-01 ENCOUNTER — Other Ambulatory Visit: Payer: Self-pay

## 2020-06-01 ENCOUNTER — Ambulatory Visit (HOSPITAL_COMMUNITY)
Admission: EM | Admit: 2020-06-01 | Discharge: 2020-06-01 | Disposition: A | Discharge status: Home / Self Care | Payer: BC Managed Care – PPO | Attending: Physician Assistant | Admitting: Physician Assistant

## 2020-06-01 ENCOUNTER — Encounter (HOSPITAL_COMMUNITY): Payer: Self-pay

## 2020-06-01 DIAGNOSIS — L538 Other specified erythematous conditions: Secondary | ICD-10-CM | POA: Diagnosis not present

## 2020-06-01 DIAGNOSIS — B078 Other viral warts: Secondary | ICD-10-CM | POA: Diagnosis not present

## 2020-06-01 DIAGNOSIS — L700 Acne vulgaris: Secondary | ICD-10-CM | POA: Diagnosis not present

## 2020-06-01 DIAGNOSIS — L905 Scar conditions and fibrosis of skin: Secondary | ICD-10-CM | POA: Diagnosis not present

## 2020-06-01 DIAGNOSIS — H6983 Other specified disorders of Eustachian tube, bilateral: Secondary | ICD-10-CM | POA: Diagnosis not present

## 2020-06-01 MED ORDER — FLUTICASONE PROPIONATE 50 MCG/ACT NA SUSP: 1.0000 | Freq: Every day | NASAL | 2 refills | Status: DC

## 2020-06-01 MED ORDER — CETIRIZINE HCL 10 MG PO TABS: 10.0000 mg | ORAL_TABLET | Freq: Every day | ORAL | 0 refills | Status: DC

## 2020-06-01 NOTE — ED Triage Notes (Signed)
Pt presents with bilateral ear pain x 3 days. Denies fever, chills or any other symptoms.

## 2020-06-01 NOTE — Discharge Instructions (Signed)
Use flonase 2 times a day for 1 week, then 1 time daily Take zyrtec daily  Follow up with your primary care in about 2 weeks if not improving, sooner if symptoms worsen

## 2020-06-01 NOTE — ED Provider Notes (Signed)
[facility]    CSN: [id number] Arrival date & time: 06/01/20  1937      History   Chief Complaint Chief Complaint  Patient presents with  . Otalgia    HPI Hannah Weber is a 29 y.o. female.   Patient reports for ear fullness for the last few days.  She denies pain per se.  She reports she has had occasional stuffy feeling, otherwise denies other symptoms.  Denies runny nose, cough, sore throat, fever, chills.  Has not had any ear drainage.  No recent travel.     Past Medical History:  Diagnosis Date  . Allergy     Patient Active Problem List   Diagnosis Date Noted  . Ketonuria 01/12/2018  . Achilles tendinitis of both lower extremities 11/09/2017  . Smoker unmotivated to quit 09/07/2015    History reviewed. No pertinent surgical history.  OB History   No obstetric history on file.      Home Medications    Prior to Admission medications   Medication Sig Start Date End Date Taking? Authorizing Provider  cetirizine (ZYRTEC ALLERGY) 10 MG tablet Take 1 tablet (10 mg total) by mouth daily. 06/01/20   Crisol Muecke, Marguerita Beards, PA-C  famotidine (PEPCID) 20 MG tablet Take 1 tablet (20 mg total) by mouth 2 (two) times daily. Patient taking differently: Take 20 mg by mouth 2 (two) times daily. Patient uses as needed 03/13/20   Tasia Catchings, Amy V, PA-C  fluticasone (FLONASE) 50 MCG/ACT nasal spray Place 1 spray into both nostrils daily. 06/01/20   Natallie Ravenscroft, Marguerita Beards, PA-C  naproxen (NAPROSYN) 500 MG tablet Take 1 tablet (500 mg total) by mouth 2 (two) times daily. 03/13/20   Tasia Catchings, Amy V, PA-C  tiZANidine (ZANAFLEX) 2 MG tablet Take 1 tablet (2 mg total) by mouth every 8 (eight) hours as needed for muscle spasms. 03/13/20   Ok Edwards, PA-C    Family History Family History  Problem Relation Age of Onset  . Diabetes Mother     Social History Social History   Tobacco Use  . Smoking status: Current Every Day Smoker    Packs/day: 0.10  . Smokeless tobacco: Never Used  Vaping Use  .  Vaping Use: Never used  Substance Use Topics  . Alcohol use: Yes    Comment: occ  . Drug use: Not Currently    Types: Marijuana     Allergies   Apple and Other   Review of Systems Review of Systems   Physical Exam Triage Vital Signs ED Triage Vitals  Enc Vitals Group     BP      Pulse      Resp      Temp      Temp src      SpO2      Weight      Height      Head Circumference      Peak Flow      Pain Score      Pain Loc      Pain Edu?      Excl. in [place]?    No data found.  Updated Vital Signs BP 114/87 (BP Location: Left Arm)   Pulse 85   Temp 98.5 F (36.9 C) (Oral)   Resp 18   LMP  (Within Months) Comment: 1 month  SpO2 99%   Visual Acuity Right Eye Distance:   Left Eye Distance:   Bilateral Distance:    Right Eye Near:  Left Eye Near:    Bilateral Near:     Physical Exam Vitals and nursing note reviewed.  Constitutional:      General: She is not in acute distress.    Appearance: She is well-developed. She is not ill-appearing.  HENT:     Head: Normocephalic and atraumatic.     Right Ear: Ear canal and external ear normal.     Left Ear: Ear canal and external ear normal.     Ears:     Comments: Slight serous effusions bilaterally Eyes:     Conjunctiva/sclera: Conjunctivae normal.  Cardiovascular:     Rate and Rhythm: Normal rate and regular rhythm.     Heart sounds: No murmur heard.   Pulmonary:     Effort: Pulmonary effort is normal. No respiratory distress.     Breath sounds: Normal breath sounds.  Abdominal:     Palpations: Abdomen is soft.     Tenderness: There is no abdominal tenderness.  Musculoskeletal:     Cervical back: Neck supple.  Skin:    General: Skin is warm and dry.  Neurological:     Mental Status: She is alert.      UC Treatments / Results  Labs (all labs ordered are listed, but only abnormal results are displayed) Labs Reviewed - No data to display  EKG   Radiology No results  found.  Procedures Procedures (including critical care time)  Medications Ordered in UC Medications - No data to display  Initial Impression / Assessment and Plan / UC Course  I have reviewed the triage vital signs and the nursing notes.  Pertinent labs & imaging results that were available during my care of the patient were reviewed by me and considered in my medical decision making (see chart for details).     #Eustachian tube dysfunction Patient is a 29 year old presenting with eustachian tube dysfunction.  We will treat with Flonase and Zyrtec.  Discussed expectations of resolution and that she should follow-up with her primary care about 2 weeks.  Discussed return and other follow-up precautions.  Patient verbalized understanding plan of care Final Clinical Impressions(s) / UC Diagnoses   Final diagnoses:  Eustachian tube dysfunction, bilateral     Discharge Instructions     Use flonase 2 times a day for 1 week, then 1 time daily Take zyrtec daily  Follow up with your primary care in about 2 weeks if not improving, sooner if symptoms worsen      ED Prescriptions    Medication Sig Dispense Auth. Provider   fluticasone (FLONASE) 50 MCG/ACT nasal spray Place 1 spray into both nostrils daily. 15.8 mL Kaliann Coryell, Marguerita Beards, PA-C   cetirizine (ZYRTEC ALLERGY) 10 MG tablet Take 1 tablet (10 mg total) by mouth daily. 30 tablet Girtha Kilgore, Marguerita Beards, PA-C     PDMP not reviewed this encounter.   Purnell Shoemaker, PA-C 06/02/20 0005

## 2020-06-02 ENCOUNTER — Encounter (HOSPITAL_COMMUNITY): Payer: Self-pay

## 2020-06-02 ENCOUNTER — Emergency Department (HOSPITAL_COMMUNITY)
Admission: EM | Admit: 2020-06-02 | Discharge: 2020-06-02 | Disposition: A | Discharge status: Home / Self Care | Payer: BC Managed Care – PPO | Attending: Emergency Medicine | Admitting: Emergency Medicine

## 2020-06-02 ENCOUNTER — Emergency Department (HOSPITAL_COMMUNITY): Payer: BC Managed Care – PPO

## 2020-06-02 ENCOUNTER — Other Ambulatory Visit: Payer: Self-pay

## 2020-06-02 DIAGNOSIS — F17200 Nicotine dependence, unspecified, uncomplicated: Secondary | ICD-10-CM | POA: Diagnosis not present

## 2020-06-02 DIAGNOSIS — R519 Headache, unspecified: Secondary | ICD-10-CM | POA: Insufficient documentation

## 2020-06-02 DIAGNOSIS — Z79899 Other long term (current) drug therapy: Secondary | ICD-10-CM | POA: Insufficient documentation

## 2020-06-02 DIAGNOSIS — R42 Dizziness and giddiness: Secondary | ICD-10-CM | POA: Diagnosis not present

## 2020-06-02 DIAGNOSIS — H9209 Otalgia, unspecified ear: Secondary | ICD-10-CM | POA: Diagnosis not present

## 2020-06-02 MED ORDER — MECLIZINE HCL 25 MG PO TABS: 25.0000 mg | ORAL_TABLET | Freq: Three times a day (TID) | ORAL | 0 refills | Status: DC | PRN

## 2020-06-02 NOTE — ED Provider Notes (Signed)
[facility] DEPT Provider Note   CSN: [id number] Arrival date & time: 06/02/20  1924     History No chief complaint on file.   Hannah Weber is a 29 y.o. female.  HPI Patient presents with headache and ear pain along with some dizziness.  Has been seen in urgent care and diagnosed with eustachian tube dysfunction.  States now has more of a headache on top of her head.  States she feels dizzy and sometimes feels that things are moving.  States it is not sure if you like vertigo that she has had in the past however.  Pain is dull.  Top of her head.  Sometimes goes to the back of her head.  Also will go away completely in between times.  She was given prescription for some decongestants and allergy medicines that she already had at home and she states that she took them without much relief.    Past Medical History:  Diagnosis Date   Allergy     Patient Active Problem List   Diagnosis Date Noted   Ketonuria 01/12/2018   Achilles tendinitis of both lower extremities 11/09/2017   Smoker unmotivated to quit 09/07/2015    History reviewed. No pertinent surgical history.   OB History   No obstetric history on file.     Family History  Problem Relation Age of Onset   Diabetes Mother     Social History   Tobacco Use   Smoking status: Current Every Day Smoker    Packs/day: 0.10   Smokeless tobacco: Never Used  Vaping Use   Vaping Use: Never used  Substance Use Topics   Alcohol use: Yes    Comment: occ   Drug use: Not Currently    Types: Marijuana    Home Medications Prior to Admission medications   Medication Sig Start Date End Date Taking? Authorizing Provider  cetirizine (ZYRTEC ALLERGY) 10 MG tablet Take 1 tablet (10 mg total) by mouth daily. 06/01/20   Darr, Marguerita Beards, PA-C  famotidine (PEPCID) 20 MG tablet Take 1 tablet (20 mg total) by mouth 2 (two) times daily. Patient taking differently: Take 20 mg by mouth 2 (two) times  daily. Patient uses as needed 03/13/20   Tasia Catchings, Amy V, PA-C  fluticasone (FLONASE) 50 MCG/ACT nasal spray Place 1 spray into both nostrils daily. 06/01/20   Darr, Marguerita Beards, PA-C  meclizine (ANTIVERT) 25 MG tablet Take 1 tablet (25 mg total) by mouth 3 (three) times daily as needed for dizziness. 06/02/20   Davonna Belling, MD  naproxen (NAPROSYN) 500 MG tablet Take 1 tablet (500 mg total) by mouth 2 (two) times daily. 03/13/20   Tasia Catchings, Amy V, PA-C  tiZANidine (ZANAFLEX) 2 MG tablet Take 1 tablet (2 mg total) by mouth every 8 (eight) hours as needed for muscle spasms. 03/13/20   Ok Edwards, PA-C    Allergies    Apple and Other  Review of Systems   Review of Systems  Constitutional: Negative for appetite change.  HENT: Positive for ear pain. Negative for sore throat.   Cardiovascular: Negative for chest pain.  Gastrointestinal: Negative for abdominal pain.  Genitourinary: Negative for flank pain.  Musculoskeletal: Negative for back pain.  Skin: Negative for rash.  Neurological: Positive for headaches. Negative for weakness.    Physical Exam Updated Vital Signs BP (!) 157/91 (BP Location: Right Arm)    Pulse (!) 102    Temp 98.2 F (36.8 C) (Oral)  Resp 16    LMP 04/13/2020    SpO2 100%   Physical Exam Vitals and nursing note reviewed.  HENT:     Head: Atraumatic.     Ears:     Comments: Small bilateral effusion behind TM without clear bulging or erythema. Eyes:     Extraocular Movements: Extraocular movements intact.     Pupils: Pupils are equal, round, and reactive to light.  Cardiovascular:     Rate and Rhythm: Regular rhythm.  Musculoskeletal:        General: No tenderness.     Cervical back: Neck supple.  Skin:    General: Skin is warm.     Capillary Refill: Capillary refill takes less than 2 seconds.  Neurological:     Mental Status: She is alert and oriented to person, place, and time.   No nystagmus.  Finger-nose intact.  No Romberg.  ED Results / Procedures / Treatments     Labs (all labs ordered are listed, but only abnormal results are displayed) Labs Reviewed - No data to display  EKG None  Radiology CT Head Wo Contrast  Result Date: 06/02/2020 CLINICAL DATA:  Headache. EXAM: CT HEAD WITHOUT CONTRAST TECHNIQUE: Contiguous axial images were obtained from the base of the skull through the vertex without intravenous contrast. COMPARISON:  September 04, 2018 FINDINGS: Brain: No evidence of acute infarction, hemorrhage, hydrocephalus, extra-axial collection or mass lesion/mass effect. Vascular: No hyperdense vessel or unexpected calcification. Skull: Normal. Negative for fracture or focal lesion. Sinuses/Orbits: No acute finding. Other: None. IMPRESSION: No acute intracranial pathology. Electronically Signed   By: Virgina Norfolk M.D.   On: 06/02/2020 21:12    Procedures Procedures (including critical care time)  Medications Ordered in ED Medications - No data to display  ED Course  I have reviewed the triage vital signs and the nursing notes.  Pertinent labs & imaging results that were available during my care of the patient were reviewed by me and considered in my medical decision making (see chart for details).    MDM Rules/Calculators/A&P                          Patient with headache and vertigo.  Some ear pain.  I think most likely peripheral cause of the dizziness.  Doubt other pathology such as anemia.  However does have headache unlike her typical headaches.  CT scan done reassuring.  Will discharge home with symptomatic treatment of the dizziness. Final Clinical Impression(s) / ED Diagnoses Final diagnoses:  Nonintractable headache, unspecified chronicity pattern, unspecified headache type  Vertigo    Rx / DC Orders ED Discharge Orders         Ordered    meclizine (ANTIVERT) 25 MG tablet  3 times daily PRN        06/02/20 2126           Davonna Belling, MD 06/02/20 2137

## 2020-06-02 NOTE — ED Triage Notes (Signed)
Pt complains of intermittent vertigo with headaches for four days, pt denies any vomiting or nausea

## 2020-06-02 NOTE — ED Notes (Signed)
Patient states she has had a dull headache in back of head radiating to top of head x 4 days. Patient states she has been dizzy x 4 days with no syncopal episode. Patient denies N/VD.

## 2020-06-17 ENCOUNTER — Encounter: Payer: Self-pay | Admitting: Internal Medicine

## 2020-06-17 ENCOUNTER — Other Ambulatory Visit: Payer: Self-pay

## 2020-06-17 ENCOUNTER — Ambulatory Visit (INDEPENDENT_AMBULATORY_CARE_PROVIDER_SITE_OTHER): Payer: BC Managed Care – PPO | Admitting: Internal Medicine

## 2020-06-17 VITALS — BP 126/85 | HR 94 | Temp 97.3°F | Resp 17 | Wt 229.0 lb

## 2020-06-17 DIAGNOSIS — N926 Irregular menstruation, unspecified: Secondary | ICD-10-CM | POA: Diagnosis not present

## 2020-06-17 DIAGNOSIS — Z1159 Encounter for screening for other viral diseases: Secondary | ICD-10-CM

## 2020-06-17 DIAGNOSIS — E221 Hyperprolactinemia: Secondary | ICD-10-CM | POA: Diagnosis not present

## 2020-06-17 DIAGNOSIS — Z114 Encounter for screening for human immunodeficiency virus [HIV]: Secondary | ICD-10-CM | POA: Diagnosis not present

## 2020-06-17 LAB — POCT URINE PREGNANCY: Preg Test, Ur: NEGATIVE

## 2020-06-17 MED ORDER — NORETHIN ACE-ETH ESTRAD-FE 1-20 MG-MCG(24) PO TABS: 1.0000 | ORAL_TABLET | Freq: Every day | ORAL | 11 refills | Status: DC

## 2020-06-17 NOTE — Progress Notes (Signed)
Subjective:    Hannah Weber - 29 y.o. female MRN [id number]  Date of birth: 1991/08/29  HPI  SIREN PORRATA is here for concern about menstrual cycles. Reports this has been going on for >1.5 years. Was on birth control briefly back in high school. Otherwise no contraception. Having menstrual cycle every 3-4 months. Lasting a "normal amount of time" but heavier the first few days. Reports that prior to this, menses were always regular. Was seen by OB in March and reports normal work up and PAP. Denies significant acne. Endorses excessive facial hair. Has gained over 40 lbs in less than 2 years.    Health Maintenance:  Health Maintenance Due  Topic Date Due  . Hepatitis C Screening  Never done  . COVID-19 Vaccine (1) Never done  . HIV Screening  Never done  . TETANUS/TDAP  Never done  . PAP-Cervical Cytology Screening  Never done  . PAP SMEAR-Modifier  Never done    -  reports that she has been smoking. She has been smoking about 0.10 packs per day. She has never used smokeless tobacco. - Review of Systems: Per HPI. - Past Medical History: Patient Active Problem List   Diagnosis Date Noted  . Ketonuria 01/12/2018  . Achilles tendinitis of both lower extremities 11/09/2017  . Smoker unmotivated to quit 09/07/2015   - Medications: reviewed and updated   Objective:   Physical Exam BP 126/85   Pulse 94   Temp (!) 97.3 F (36.3 C) (Temporal)   Resp 17   Wt 229 lb (103.9 kg)   SpO2 98%   BMI 34.82 kg/m  Physical Exam Constitutional:      General: She is not in acute distress.    Appearance: She is not diaphoretic.  Cardiovascular:     Rate and Rhythm: Normal rate.  Pulmonary:     Effort: Pulmonary effort is normal. No respiratory distress.  Musculoskeletal:        General: Normal range of motion.  Skin:    General: Skin is warm and dry.  Neurological:     Mental Status: She is alert and oriented to person, place, and time.  Psychiatric:        Mood and Affect:  Affect normal.        Judgment: Judgment normal.            Assessment & Plan:   1. Menstrual problem Seems anovulatory in nature. Patient at increased risk of PCOS due to her obesity and symptoms of excessive facial hair also concerning. Will obtain some labs for further evaluation. Suspect anovulation is more likely than anatomic cause of the bleeding pattern given she is experiencing irregular menses rather than HMB. However, will obtain ultrasound per patient's request, to r/o out anatomic causes, and to use as another test to evaluate for PCOS. Given obesity with AUB, patient at higher risk of hyperplasia despite her age and will have her return for endometrial biopsy. Discussed methods of controlling menstrual cycles including OCP and IUD. Discussed that she is not a preferred candidate for OCP due to smoking history but given that age is <35 could trial for now as she is presently opposed to IUD.  - CBC with Differential - TSH - POCT urine pregnancy - Norethindrone Acetate-Ethinyl Estrad-FE (LOESTRIN 24 FE) 1-20 MG-MCG(24) tablet; Take 1 tablet by mouth daily.  Dispense: 28 tablet; Refill: 11 - US PELVIC COMPLETE WITH TRANSVAGINAL; Future - Estradiol - FSH/LH - Testosterone - Prolactin  2. Need  for hepatitis C screening test - HCV Ab w/Rflx to Verification  3. Screening for HIV (human immunodeficiency virus) - HIV antibody (with reflex)     Phill Myron, D.O. 06/17/2020, 10:19 AM [facility]

## 2020-06-18 ENCOUNTER — Other Ambulatory Visit: Payer: Self-pay | Admitting: Internal Medicine

## 2020-06-18 DIAGNOSIS — E221 Hyperprolactinemia: Secondary | ICD-10-CM | POA: Insufficient documentation

## 2020-06-18 LAB — CBC WITH DIFFERENTIAL/PLATELET
Basophils Absolute: 0 10*3/uL (ref 0.0–0.2)
Basos: 0 %
EOS (ABSOLUTE): 0.2 10*3/uL (ref 0.0–0.4)
Eos: 2 %
Hematocrit: 41.8 % (ref 34.0–46.6)
Hemoglobin: 13.9 g/dL (ref 11.1–15.9)
Immature Grans (Abs): 0 10*3/uL (ref 0.0–0.1)
Immature Granulocytes: 0 %
Lymphocytes Absolute: 4.1 10*3/uL — ABNORMAL HIGH (ref 0.7–3.1)
Lymphs: 40 %
MCH: 29.5 pg (ref 26.6–33.0)
MCHC: 33.3 g/dL (ref 31.5–35.7)
MCV: 89 fL (ref 79–97)
Monocytes Absolute: 0.7 10*3/uL (ref 0.1–0.9)
Monocytes: 6 %
Neutrophils Absolute: 5.3 10*3/uL (ref 1.4–7.0)
Neutrophils: 52 %
Platelets: 206 10*3/uL (ref 150–450)
RBC: 4.71 x10E6/uL (ref 3.77–5.28)
RDW: 13.4 % (ref 11.7–15.4)
WBC: 10.2 10*3/uL (ref 3.4–10.8)

## 2020-06-18 LAB — PROLACTIN: Prolactin: 34.6 ng/mL — ABNORMAL HIGH (ref 4.8–23.3)

## 2020-06-18 LAB — FSH/LH
FSH: 4.2 m[IU]/mL
LH: 9.2 m[IU]/mL

## 2020-06-18 LAB — HCV AB W/RFLX TO VERIFICATION: HCV Ab: 0.1 s/co ratio (ref 0.0–0.9)

## 2020-06-18 LAB — ESTRADIOL: Estradiol: 66.9 pg/mL

## 2020-06-18 LAB — HCV INTERPRETATION

## 2020-06-18 LAB — TESTOSTERONE: Testosterone: 45 ng/dL (ref 13–71)

## 2020-06-18 LAB — HIV ANTIBODY (ROUTINE TESTING W REFLEX): HIV Screen 4th Generation wRfx: NONREACTIVE

## 2020-06-18 LAB — TSH: TSH: 3.91 u[IU]/mL (ref 0.450–4.500)

## 2020-06-18 NOTE — Progress Notes (Signed)
Patient notified of results & recommendations. Expressed understanding.

## 2020-06-21 LAB — BASIC METABOLIC PANEL
BUN/Creatinine Ratio: 13 (ref 9–23)
BUN: 12 mg/dL (ref 6–20)
CO2: 18 mmol/L — ABNORMAL LOW (ref 20–29)
Calcium: 9.1 mg/dL (ref 8.7–10.2)
Chloride: 104 mmol/L (ref 96–106)
Creatinine, Ser: 0.92 mg/dL (ref 0.57–1.00)
GFR calc Af Amer: 97 mL/min/{1.73_m2} (ref >59)
GFR calc non Af Amer: 84 mL/min/{1.73_m2} (ref >59)
Glucose: 90 mg/dL (ref 65–99)
Potassium: 4.2 mmol/L (ref 3.5–5.2)
Sodium: 141 mmol/L (ref 134–144)

## 2020-06-21 LAB — SPECIMEN STATUS REPORT

## 2020-06-28 ENCOUNTER — Emergency Department (HOSPITAL_COMMUNITY)
Admission: EM | Admit: 2020-06-28 | Discharge: 2020-06-28 | Disposition: A | Discharge status: Home / Self Care | Payer: BC Managed Care – PPO | Attending: Emergency Medicine | Admitting: Emergency Medicine

## 2020-06-28 ENCOUNTER — Other Ambulatory Visit: Payer: Self-pay

## 2020-06-28 ENCOUNTER — Encounter (HOSPITAL_COMMUNITY): Payer: Self-pay

## 2020-06-28 DIAGNOSIS — F17200 Nicotine dependence, unspecified, uncomplicated: Secondary | ICD-10-CM | POA: Insufficient documentation

## 2020-06-28 DIAGNOSIS — H81393 Other peripheral vertigo, bilateral: Secondary | ICD-10-CM | POA: Diagnosis not present

## 2020-06-28 DIAGNOSIS — H81399 Other peripheral vertigo, unspecified ear: Secondary | ICD-10-CM

## 2020-06-28 DIAGNOSIS — H6983 Other specified disorders of Eustachian tube, bilateral: Secondary | ICD-10-CM

## 2020-06-28 DIAGNOSIS — H6993 Unspecified Eustachian tube disorder, bilateral: Secondary | ICD-10-CM | POA: Diagnosis not present

## 2020-06-28 DIAGNOSIS — H9203 Otalgia, bilateral: Secondary | ICD-10-CM | POA: Diagnosis not present

## 2020-06-28 MED ORDER — METHYLPREDNISOLONE 4 MG PO TBPK: ORAL_TABLET | ORAL | 0 refills | Status: DC

## 2020-06-28 NOTE — ED Notes (Signed)
Discharge paperwork reviewed with pt, including prescription.  Pt with no questions or concerns regarding todays visit.  Ambulatory at time of discharge.

## 2020-06-28 NOTE — Discharge Instructions (Addendum)
OTC meds: Take xyzal 1 tablet at bedtime You may add Afrin 1 spray in each nostril 2 times a day for the next 2 days and then stop. Meclizine (non drowsy dramamine or Bonine) for dizziness Contact a health care provider if: Your symptoms do not go away after treatment. Your symptoms come back after treatment. You are unable to pop your ears. You have: A fever. Pain in your ear. Pain in your head or neck. Fluid draining from your ear. Your hearing suddenly changes. You become very dizzy. You lose your balance.

## 2020-06-28 NOTE — ED Provider Notes (Signed)
[facility] DEPT Provider Note   CSN: [id number] Arrival date & time: 06/28/20  1543     History Chief Complaint  Patient presents with  . Otalgia    Hannah Weber is a 29 y.o. female with a past medical history of allergies who presents emergency department chief complaint of ear fullness and discomfort.  Patient was seen on 9 1 with similar symptoms but including headache and intermittent dizziness.  Patient states that since that time she has had persistent ear fullness, feeling like she needs to pop her ears, intermittent vertigo lasting no longer than 3 minutes after she is turned her head.  She denies ataxia, headache, disequilibrium.  She has been taking Flonase without relief of her symptoms.  She has tried Epley's maneuvers at home without relief.  She denies fever, neck stiffness.  HPI     Past Medical History:  Diagnosis Date  . Allergy     Patient Active Problem List   Diagnosis Date Noted  . Hyperprolactinemia ([place]) 06/18/2020  . Ketonuria 01/12/2018  . Achilles tendinitis of both lower extremities 11/09/2017  . Smoker unmotivated to quit 09/07/2015    History reviewed. No pertinent surgical history.   OB History   No obstetric history on file.     Family History  Problem Relation Age of Onset  . Diabetes Mother     Social History   Tobacco Use  . Smoking status: Current Every Day Smoker    Packs/day: 0.10  . Smokeless tobacco: Never Used  Vaping Use  . Vaping Use: Never used  Substance Use Topics  . Alcohol use: Yes    Comment: occ  . Drug use: Not Currently    Types: Marijuana    Home Medications Prior to Admission medications   Medication Sig Start Date End Date Taking? Authorizing Provider  cetirizine (ZYRTEC ALLERGY) 10 MG tablet Take 1 tablet (10 mg total) by mouth daily. 06/01/20   Darr, Marguerita Beards, PA-C  famotidine (PEPCID) 20 MG tablet Take 1 tablet (20 mg total) by mouth 2 (two) times daily. Patient  taking differently: Take 20 mg by mouth 2 (two) times daily. Patient uses as needed 03/13/20   Tasia Catchings, Amy V, PA-C  fluticasone (FLONASE) 50 MCG/ACT nasal spray Place 1 spray into both nostrils daily. 06/01/20   Darr, Marguerita Beards, PA-C  meclizine (ANTIVERT) 25 MG tablet Take 1 tablet (25 mg total) by mouth 3 (three) times daily as needed for dizziness. 06/02/20   Davonna Belling, MD  Norethindrone Acetate-Ethinyl Estrad-FE (LOESTRIN 24 FE) 1-20 MG-MCG(24) tablet Take 1 tablet by mouth daily. 06/17/20   Nicolette Bang, DO    Allergies    Apple and Other  Review of Systems   Review of Systems Ten systems reviewed and are negative for acute change, except as noted in the HPI.   Physical Exam Updated Vital Signs BP 132/86 (BP Location: Right Arm)   Pulse 94   Temp 99.1 F (37.3 C) (Oral)   Resp 18   Ht 5\' 8"  (1.727 m)   Wt 104.3 kg   SpO2 100%   BMI 34.97 kg/m   Physical Exam Vitals and nursing note reviewed.  Constitutional:      General: She is not in acute distress.    Appearance: She is well-developed. She is not diaphoretic.  HENT:     Head: Normocephalic and atraumatic.     Right Ear: Tympanic membrane normal.     Left Ear: Tympanic membrane normal.  Ears:     Comments:   Mild scarring at the base of the right eardrum.  History of tympanostomy tubes Eyes:     General: No scleral icterus.    Conjunctiva/sclera: Conjunctivae normal.  Cardiovascular:     Rate and Rhythm: Normal rate and regular rhythm.     Heart sounds: Normal heart sounds. No murmur heard.  No friction rub. No gallop.   Pulmonary:     Effort: Pulmonary effort is normal. No respiratory distress.     Breath sounds: Normal breath sounds.  Abdominal:     General: Bowel sounds are normal. There is no distension.     Palpations: Abdomen is soft. There is no mass.     Tenderness: There is no abdominal tenderness. There is no guarding.  Musculoskeletal:     Cervical back: Normal range of motion.   Skin:    General: Skin is warm and dry.  Neurological:     Mental Status: She is alert and oriented to person, place, and time.  Psychiatric:        Behavior: Behavior normal.     ED Results / Procedures / Treatments   Labs (all labs ordered are listed, but only abnormal results are displayed) Labs Reviewed - No data to display  EKG None  Radiology No results found.  Procedures Procedures (including critical care time)  Medications Ordered in ED Medications - No data to display  ED Course  I have reviewed the triage vital signs and the nursing notes.  Pertinent labs & imaging results that were available during my care of the patient were reviewed by me and considered in my medical decision making (see chart for details).    MDM Rules/Calculators/A&P                          Patient here with complaint of ear fullness.  Bilateral ears are normal on examination.  No active vertigo.  Suspect eustachian tube dysfunction.  I have ordered prednisone taper, I have suggested she take over-the-counter Xyzal, meclizine for dizziness if needed, and she may do 2 days of Afrin in both nostrils twice a day and then discontinue.  Ultimately I think she has a referral to ENT.  She does not appear to have any emergent cause of her symptoms.  Patient appears otherwise appropriate for discharge at this time. Final Clinical Impression(s) / ED Diagnoses Final diagnoses:  None    Rx / DC Orders ED Discharge Orders    None       Margarita Mail, PA-C 06/28/20 1807    Hayden Rasmussen, MD 06/29/20 1009

## 2020-06-28 NOTE — ED Triage Notes (Signed)
Pt c/o bilateral ear pain and fullness x3 weeks

## 2020-07-02 ENCOUNTER — Ambulatory Visit
Admission: RE | Admit: 2020-07-02 | Discharge: 2020-07-02 | Disposition: A | Discharge status: Home / Self Care | Payer: BC Managed Care – PPO | Source: Ambulatory Visit | Attending: Internal Medicine | Admitting: Internal Medicine

## 2020-07-02 DIAGNOSIS — N926 Irregular menstruation, unspecified: Secondary | ICD-10-CM | POA: Diagnosis not present

## 2020-07-02 DIAGNOSIS — D259 Leiomyoma of uterus, unspecified: Secondary | ICD-10-CM | POA: Diagnosis not present

## 2020-07-06 ENCOUNTER — Other Ambulatory Visit: Payer: Self-pay | Admitting: Internal Medicine

## 2020-07-07 ENCOUNTER — Telehealth: Payer: Self-pay | Admitting: Internal Medicine

## 2020-07-07 NOTE — Telephone Encounter (Signed)
Pt called asking for her kidneys to be checked before the MRI

## 2020-07-08 NOTE — Telephone Encounter (Signed)
Pt is asking for MRI w/o contrast.

## 2020-07-09 NOTE — Progress Notes (Signed)
Patient notified of results & recommendations. Expressed understanding.

## 2020-07-11 ENCOUNTER — Other Ambulatory Visit: Payer: BC Managed Care – PPO

## 2020-07-12 NOTE — Telephone Encounter (Signed)
Yes she would like a redo

## 2020-07-12 NOTE — Telephone Encounter (Signed)
I am a bit confused. She had her kidney function tested in Sept when we initially ordered the MRI and it was normal. Is she asking to have this repeated?   Phill Myron, D.O. Primary Care at [facility]  07/12/2020, 9:36 AM

## 2020-07-13 ENCOUNTER — Ambulatory Visit: Payer: BC Managed Care – PPO | Admitting: Internal Medicine

## 2020-07-15 NOTE — Telephone Encounter (Signed)
That's fine, we can have her come in for a redo.   Phill Myron, D.O. Primary Care at [facility]  07/15/2020, 4:40 PM

## 2020-07-19 ENCOUNTER — Encounter (INDEPENDENT_AMBULATORY_CARE_PROVIDER_SITE_OTHER): Payer: Self-pay | Admitting: Otolaryngology

## 2020-07-19 ENCOUNTER — Other Ambulatory Visit: Payer: Self-pay

## 2020-07-19 ENCOUNTER — Ambulatory Visit (INDEPENDENT_AMBULATORY_CARE_PROVIDER_SITE_OTHER): Payer: BC Managed Care – PPO | Admitting: Otolaryngology

## 2020-07-19 VITALS — Temp 97.9°F

## 2020-07-19 DIAGNOSIS — R42 Dizziness and giddiness: Secondary | ICD-10-CM | POA: Diagnosis not present

## 2020-07-19 DIAGNOSIS — M26609 Unspecified temporomandibular joint disorder, unspecified side: Secondary | ICD-10-CM

## 2020-07-19 DIAGNOSIS — H9313 Tinnitus, bilateral: Secondary | ICD-10-CM | POA: Diagnosis not present

## 2020-07-19 NOTE — Progress Notes (Signed)
HPI: Hannah Weber is a 29 y.o. female who presents for evaluation of eustachian tube dysfunction.  She has complained of chronic fullness in her ears although she has had no hearing problems.  She has occasional ear pain.  She is also had dizziness that comes and goes but does not really describe true vertigo.  She was treated with Flonase but has not been using any over the last week. Patient does have history of TMJ problems.  Past Medical History:  Diagnosis Date   Allergy    No past surgical history on file. Social History   Socioeconomic History   Marital status: Significant Other    Spouse name: Mertie Clause   Number of children: Not on file   Years of education: Not on file   Highest education level: Not on file  Occupational History   Occupation: customer service    Employer: [employer]  Tobacco Use   Smoking status: Current Some Day Smoker    Packs/day: 0.10    Years: 15.00    Pack years: 1.50    Start date: 2006   Smokeless tobacco: Never Used  Scientific laboratory technician Use: Never used  Substance and Sexual Activity   Alcohol use: Yes    Comment: occ   Drug use: Not Currently    Types: Marijuana   Sexual activity: Never    Birth control/protection: None  Other Topics Concern   Not on file  Social History Narrative   Not on file   Social Determinants of Health   Financial Resource Strain:    Difficulty of Paying Living Expenses: Not on file  Food Insecurity:    Worried About Charity fundraiser in the Last Year: Not on file   [employer] of Food in the Last Year: Not on file  Transportation Needs:    Lack of Transportation (Medical): Not on file   Lack of Transportation (Non-Medical): Not on file  Physical Activity:    Days of Exercise per Week: Not on file   Minutes of Exercise per Session: Not on file  Stress:    Feeling of Stress : Not on file  Social Connections:    Frequency of Communication with Friends and Family: Not on file    Frequency of Social Gatherings with Friends and Family: Not on file   Attends Religious Services: Not on file   Active Member of Clubs or Organizations: Not on file   Attends Archivist Meetings: Not on file   Marital Status: Not on file   Family History  Problem Relation Age of Onset   Diabetes Mother    Allergies  Allergen Reactions   Apple    Other     mangos   Prior to Admission medications   Medication Sig Start Date End Date Taking? Authorizing Provider  cetirizine (ZYRTEC ALLERGY) 10 MG tablet Take 1 tablet (10 mg total) by mouth daily. 06/01/20  Yes Darr, Marguerita Beards, PA-C  famotidine (PEPCID) 20 MG tablet Take 1 tablet (20 mg total) by mouth 2 (two) times daily. Patient taking differently: Take 20 mg by mouth 2 (two) times daily. Patient uses as needed 03/13/20  Yes Yu, Amy V, PA-C  fluticasone (FLONASE) 50 MCG/ACT nasal spray Place 1 spray into both nostrils daily. 06/01/20  Yes Darr, Marguerita Beards, PA-C  meclizine (ANTIVERT) 25 MG tablet Take 1 tablet (25 mg total) by mouth 3 (three) times daily as needed for dizziness. 06/02/20  Yes Davonna Belling, MD  methylPREDNISolone (  MEDROL DOSEPAK) 4 MG TBPK tablet Use as directed 06/28/20  Yes Harris, Abigail, PA-C  Norethindrone Acetate-Ethinyl Estrad-FE (LOESTRIN 24 FE) 1-20 MG-MCG(24) tablet Take 1 tablet by mouth daily. 06/17/20  Yes Nicolette Bang, DO     Positive ROS: Otherwise negative  All other systems have been reviewed and were otherwise negative with the exception of those mentioned in the HPI and as above.  Physical Exam: Constitutional: Alert, well-appearing, no acute distress Ears: External ears without lesions or tenderness. Ear canals are clear bilaterally with intact, clear TMs bilaterally.  Dix-Hallpike testing was negative.  TMs are clear on microscopic exam bilaterally. Nasal: External nose without lesions. Septum deviated to the left with left septal spur.  Moderate rhinitis.  Both middle  meatus regions are clear.. Clear nasal passages Oral: Lips and gums without lesions. Tongue and palate mucosa without lesions. Posterior oropharynx clear. Neck: No palpable adenopathy or masses.  TMJ clicking and popping on the left side with opening closing the jaw. Respiratory: Breathing comfortably  Skin: No facial/neck lesions or rash noted.  Audiogram demonstrated normal hearing in both ears with SRT's of 15 dB bilaterally.  She had type A tympanograms bilaterally.  Procedures  Assessment: Mild rhinitis with septal deviation to the left. TMJ problems may be causing ear fullness and/or pain. History of dizziness but no evidence of vertigo or inner ear abnormality.  Plan: Recommended use of Flonase 2 sprays each nostril for nasal congestion. Discussed soft diet and use of NSAIDs for treatment of TMJ and would recommend follow-up with a dentist who treats TMJ for further treatment of her TMJ problems.  Radene Journey, MD

## 2020-07-20 ENCOUNTER — Encounter (HOSPITAL_COMMUNITY): Payer: Self-pay

## 2020-07-20 ENCOUNTER — Other Ambulatory Visit: Payer: Self-pay

## 2020-07-20 ENCOUNTER — Emergency Department (HOSPITAL_COMMUNITY)
Admission: EM | Admit: 2020-07-20 | Discharge: 2020-07-20 | Disposition: A | Discharge status: Home / Self Care | Payer: BC Managed Care – PPO | Attending: Emergency Medicine | Admitting: Emergency Medicine

## 2020-07-20 ENCOUNTER — Encounter (INDEPENDENT_AMBULATORY_CARE_PROVIDER_SITE_OTHER): Payer: Self-pay

## 2020-07-20 DIAGNOSIS — F17210 Nicotine dependence, cigarettes, uncomplicated: Secondary | ICD-10-CM | POA: Diagnosis not present

## 2020-07-20 DIAGNOSIS — R42 Dizziness and giddiness: Secondary | ICD-10-CM | POA: Insufficient documentation

## 2020-07-20 LAB — PREGNANCY, URINE: Preg Test, Ur: NEGATIVE

## 2020-07-20 NOTE — ED Provider Notes (Signed)
[facility] DEPT Provider Note   CSN: [id number] Arrival date & time: 07/20/20  1617     History Chief Complaint  Patient presents with  . Dizziness    Hannah Weber is a 29 y.o. female with past medical history of TMJ who presented to the ED for chief complaint of dizziness.  Patient states that these episodes of dizziness started in September 2021.  Happened at random times a day and patient states that marijuana may be a trigger, only last 5-6 seconds, no loss of consciousness.  During the episode patient denies nausea, vomiting, vertigo, room spinning sensation, and photophobia.  Patient was prescribed meclizine for dizziness but has not been taking her medications.  Patient was seen by an ENT yesterday and was diagnosed with eustachian tube dysfunction which was treated with Flonase twice daily.  Patient only had a mild episode today but decided to come into the ED for reassurance.  Patient states that she had a prolactin level checked by her PCP which was mildly elevated at 34.  She was scheduled for a brain MRI with contrast but canceled because she did not want to take the contrast.  HPI     Past Medical History:  Diagnosis Date  . Allergy     Patient Active Problem List   Diagnosis Date Noted  . Hyperprolactinemia ([place]) 06/18/2020  . Ketonuria 01/12/2018  . Achilles tendinitis of both lower extremities 11/09/2017  . Smoker unmotivated to quit 09/07/2015    History reviewed. No pertinent surgical history.   OB History   No obstetric history on file.     Family History  Problem Relation Age of Onset  . Diabetes Mother     Social History   Tobacco Use  . Smoking status: Current Some Day Smoker    Packs/day: 0.10    Years: 15.00    Pack years: 1.50    Types: Cigarettes    Start date: 2006  . Smokeless tobacco: Never Used  Vaping Use  . Vaping Use: Never used  Substance Use Topics  . Alcohol use: Yes    Comment: occ  .  Drug use: Not Currently    Types: Marijuana    Home Medications Prior to Admission medications   Medication Sig Start Date End Date Taking? Authorizing Provider  cetirizine (ZYRTEC ALLERGY) 10 MG tablet Take 1 tablet (10 mg total) by mouth daily. 06/01/20   Darr, Marguerita Beards, PA-C  famotidine (PEPCID) 20 MG tablet Take 1 tablet (20 mg total) by mouth 2 (two) times daily. Patient taking differently: Take 20 mg by mouth 2 (two) times daily. Patient uses as needed 03/13/20   Tasia Catchings, Amy V, PA-C  fluticasone (FLONASE) 50 MCG/ACT nasal spray Place 1 spray into both nostrils daily. 06/01/20   Darr, Marguerita Beards, PA-C  meclizine (ANTIVERT) 25 MG tablet Take 1 tablet (25 mg total) by mouth 3 (three) times daily as needed for dizziness. 06/02/20   Davonna Belling, MD  methylPREDNISolone (MEDROL DOSEPAK) 4 MG TBPK tablet Use as directed 06/28/20   Margarita Mail, PA-C  Norethindrone Acetate-Ethinyl Estrad-FE (LOESTRIN 24 FE) 1-20 MG-MCG(24) tablet Take 1 tablet by mouth daily. 06/17/20   Nicolette Bang, DO    Allergies    Apple and Other  Review of Systems   Review of Systems  HENT: Negative for ear pain.   Respiratory: Negative for shortness of breath.   Cardiovascular: Negative for chest pain.  Gastrointestinal: Negative for abdominal pain, diarrhea, nausea and vomiting.  Musculoskeletal: Positive for neck pain.  Neurological: Positive for light-headedness and headaches. Negative for syncope, facial asymmetry and weakness.    Physical Exam Updated Vital Signs BP 132/83 (BP Location: Left Arm)   Pulse 82   Temp 98.4 F (36.9 C) (Oral)   Resp 12   Ht 5\' 8"  (1.727 m)   Wt 104.3 kg   LMP 07/05/2020   SpO2 100%   BMI 34.97 kg/m   Physical Exam Constitutional:      General: She is not in acute distress.    Appearance: She is not toxic-appearing.  HENT:     Head: Normocephalic.     Ears:     Comments: Tympanic membrane appear normal, intact, noninfected.  No effusion no pus noted.  No  ear pain with palpation. Eyes:     General:        Right eye: No discharge.        Left eye: No discharge.  Cardiovascular:     Rate and Rhythm: Normal rate and regular rhythm.  Pulmonary:     Effort: No respiratory distress.     Breath sounds: Normal breath sounds.  Abdominal:     General: Bowel sounds are normal.     Palpations: Abdomen is soft.  Musculoskeletal:     Cervical back: Normal range of motion.  Skin:    General: Skin is warm.  Neurological:     General: No focal deficit present.     Mental Status: She is alert.     Comments: Dix-Hallpike maneuver did not produce nystagmus     ED Results / Procedures / Treatments   Labs (all labs ordered are listed, but only abnormal results are displayed) Labs Reviewed  PREGNANCY, URINE    EKG EKG Interpretation  Date/Time:  Tuesday July 20 2020 18:31:02 EDT Ventricular Rate:  82 PR Interval:    QRS Duration: 77 QT Interval:  346 QTC Calculation: 404 R Axis:   34 Text Interpretation: Sinus rhythm No previous ECGs available Confirmed by Gareth Morgan [phone]) on 07/20/2020 7:02:29 PM   Radiology No results found.  Procedures Procedures (including critical care time)  Medications Ordered in ED Medications - No data to display  ED Course  I have reviewed the triage vital signs and the nursing notes.  Pertinent labs & imaging results that were available during my care of the patient were reviewed by me and considered in my medical decision making (see chart for details).    MDM Rules/Calculators/A&P                          Patient presents to the ED for complaints of dizziness/lightheadedness after been going on since September 2021.  Episodes only last 5-6 seconds with no nausea, vomiting, loss of consciousness associated with it.  Patient state that these episodes usually triggered by marijuana use.  Head CT done in September shows no acute intracranial abnormality.  She was seen by an ENT yesterday and  assessed with no inner ear abnormality.  Otoscope exam reveals normal tympanic membranes and canals bilaterally.  Differentials include substance-induced lightheadedness versus BPPV versus central vertigo.  Patient also reports having mildly elevated prolactin at 34 and had an brain MRI scheduled.  Unlikely to be prolactinoma with this level of prolactin.  Long discussion with patient regarding her management.  She is advised to cut back on marijuana which may help reduce the severity and frequency of her symptoms.  She can also  take meclizine as needed for her symptoms.  Continue using Flonase for her rhinitis.  Regarding her TMJ, she can use NSAIDs for pain, soft diet, and gentle massage.  Follow-up with her PCP and brain MRI.  Come back to the ED for worsening symptoms.  Patient agrees with the plan.  Final Clinical Impression(s) / ED Diagnoses Final diagnoses:  Lightheadedness    Rx / DC Orders ED Discharge Orders    None       Gaylan Gerold, DO 07/20/20 1929    Gareth Morgan, MD 07/21/20 0110

## 2020-07-20 NOTE — ED Triage Notes (Addendum)
patient c/o dizziness since 06/02/20. Patient states she was seen in September in the ED for the same. patient denies any N/V, or blurred vision. Patient states that she has had an intermittent headache since 06/02/20 that she rates 1/10.

## 2020-07-21 ENCOUNTER — Other Ambulatory Visit: Payer: BC Managed Care – PPO

## 2020-08-03 ENCOUNTER — Ambulatory Visit (INDEPENDENT_AMBULATORY_CARE_PROVIDER_SITE_OTHER): Payer: BC Managed Care – PPO | Admitting: Internal Medicine

## 2020-08-03 ENCOUNTER — Other Ambulatory Visit: Payer: Self-pay

## 2020-08-03 ENCOUNTER — Other Ambulatory Visit (HOSPITAL_COMMUNITY)
Admission: RE | Admit: 2020-08-03 | Discharge: 2020-08-03 | Disposition: A | Discharge status: Home / Self Care | Payer: BC Managed Care – PPO | Source: Ambulatory Visit | Attending: Internal Medicine | Admitting: Internal Medicine

## 2020-08-03 ENCOUNTER — Encounter: Payer: Self-pay | Admitting: Internal Medicine

## 2020-08-03 DIAGNOSIS — E221 Hyperprolactinemia: Secondary | ICD-10-CM | POA: Diagnosis not present

## 2020-08-03 DIAGNOSIS — N898 Other specified noninflammatory disorders of vagina: Secondary | ICD-10-CM | POA: Insufficient documentation

## 2020-08-03 DIAGNOSIS — D260 Other benign neoplasm of cervix uteri: Secondary | ICD-10-CM | POA: Diagnosis not present

## 2020-08-03 DIAGNOSIS — N926 Irregular menstruation, unspecified: Secondary | ICD-10-CM

## 2020-08-03 NOTE — Progress Notes (Signed)
  Subjective:    CAITRIONA SUNDQUIST - 29 y.o. female MRN [id number]  Date of birth: Aug 10, 1991  HPI  ZARIELLE CEA is here for follow up of menstrual cycle concerns. At last visit, 9/16, she was started on OCPs for irregular menstrual bleeding pattern. Reports since starting medication she has had regular bleeding pattern. Labs were obtained and remarkable for elevated prolactin, MRI pending. Pelvic ultrasound showed intramural fibroid. She returns for endometrial biopsy today.      Health Maintenance: Health Maintenance Due  Topic Date Due  . COVID-19 Vaccine (1) Never done    -  reports that she has been smoking cigarettes. She started smoking about 15 years ago. She has a 1.50 pack-year smoking history. She has never used smokeless tobacco. - Review of Systems: Per HPI. - Past Medical History: Patient Active Problem List   Diagnosis Date Noted  . Hyperprolactinemia ([place]) 06/18/2020  . Ketonuria 01/12/2018  . Achilles tendinitis of both lower extremities 11/09/2017  . Smoker unmotivated to quit 09/07/2015   - Medications: reviewed and updated   Objective:   Physical Exam LMP 07/05/2020  Physical Exam     ENDOMETRIAL BIOPSY     The indications for endometrial biopsy were reviewed.   Risks of the biopsy including cramping, bleeding, infection, uterine perforation, inadequate specimen and need for additional procedures  were discussed. The patient states she understands and agrees to undergo procedure today. Consent was signed. Time out was performed.  During the pelvic exam, the cervix was prepped with Betadine. A single-toothed tenaculum was placed on the anterior lip of the cervix to stabilize it. The 3 mm pipelle was introduced into the endometrial cavity without difficulty to a depth of 6cm, and a mild amount of tissue was obtained and sent to pathology. The instruments were removed from the patient's vagina. Minimal bleeding from the cervix was noted. The patient tolerated  the procedure well. Routine post-procedure instructions were given to the patient.      Assessment & Plan:    1. Menstrual problem Endometrial biopsy was difficult due to patient's discomfort with pelvic exam. Not confident adequate endometrial sample was obtained. Discussed that if menstrual cycle is regulated with OCPs feel less concerned about need to repeat biopsy as hyperplasia is less likely due to age. Endometrial biopsy initially attempted due to obesity and menstrual cycle complaints.  - Surgical pathology  2. Hyperprolactinemia ([place]) - MR Brain Wo Contrast; Future  3. Vaginal discharge - Cervicovaginal ancillary only     Phill Myron, D.O. 08/03/2020, 3:46 PM Primary Care at [facility]

## 2020-08-04 LAB — CERVICOVAGINAL ANCILLARY ONLY
Bacterial Vaginitis (gardnerella): NEGATIVE
Candida Glabrata: NEGATIVE
Candida Vaginitis: NEGATIVE
Chlamydia: NEGATIVE
Comment: NEGATIVE
Comment: NEGATIVE
Comment: NEGATIVE
Comment: NEGATIVE
Comment: NEGATIVE
Comment: NORMAL
Neisseria Gonorrhea: NEGATIVE
Trichomonas: NEGATIVE

## 2020-08-05 LAB — SURGICAL PATHOLOGY

## 2020-08-06 NOTE — Progress Notes (Signed)
Patient notified of results & recommendations. Expressed understanding.

## 2020-09-28 ENCOUNTER — Ambulatory Visit
Admission: EM | Admit: 2020-09-28 | Discharge: 2020-09-28 | Disposition: A | Discharge status: Home / Self Care | Payer: BC Managed Care – PPO | Attending: Emergency Medicine | Admitting: Emergency Medicine

## 2020-09-28 ENCOUNTER — Other Ambulatory Visit: Payer: Self-pay

## 2020-09-28 DIAGNOSIS — R300 Dysuria: Secondary | ICD-10-CM

## 2020-09-28 DIAGNOSIS — R0982 Postnasal drip: Secondary | ICD-10-CM

## 2020-09-28 LAB — POCT URINALYSIS DIP (MANUAL ENTRY)
Bilirubin, UA: NEGATIVE
Blood, UA: NEGATIVE
Glucose, UA: NEGATIVE mg/dL
Ketones, POC UA: NEGATIVE mg/dL
Leukocytes, UA: NEGATIVE
Nitrite, UA: NEGATIVE
Protein Ur, POC: NEGATIVE mg/dL
Spec Grav, UA: 1.02 (ref 1.010–1.025)
Urobilinogen, UA: 0.2 E.U./dL
pH, UA: 7 (ref 5.0–8.0)

## 2020-09-28 NOTE — ED Provider Notes (Signed)
[name]    CSN: [id number] Arrival date & time: 09/28/20  1523      History   Chief Complaint Chief Complaint  Patient presents with  . Urinary Tract Infection  . Nasal Congestion    HPI Hannah Weber is a 29 y.o. female  History of allergies presenting for multiple concerns. Endorsing postnasal drip, intermittent cervical LAD, ear popping satiation for the last 2 weeks.  Denying cough, difficulty breathing, ear discharge or trauma.  No change in hearing, dizziness or tinnitus.  Has not take anything for this.  Not currently taking allergy medications. Patient also noting burning with urination and darker urine than normal with right low back pain x1 week.  Denying vaginal discharge, concern for STI.  States that she was recently tested for this-declines repeat STI testing.  No history of renal calculi, pyelonephritis.  Past Medical History:  Diagnosis Date  . Allergy     Patient Active Problem List   Diagnosis Date Noted  . Hyperprolactinemia (HCC) 06/18/2020  . Ketonuria 01/12/2018  . Achilles tendinitis of both lower extremities 11/09/2017  . Smoker unmotivated to quit 09/07/2015    History reviewed. No pertinent surgical history.  OB History   No obstetric history on file.      Home Medications    Prior to Admission medications   Medication Sig Start Date End Date Taking? Authorizing Provider  Norethindrone Acetate-Ethinyl Estrad-FE (LOESTRIN 24 FE) 1-20 MG-MCG(24) tablet Take 1 tablet by mouth daily. 06/17/20   Arvilla Market, DO    Family History Family History  Problem Relation Age of Onset  . Diabetes Mother     Social History Social History   Tobacco Use  . Smoking status: Current Some Day Smoker    Packs/day: 0.10    Years: 15.00    Pack years: 1.50    Types: Cigarettes    Start date: 2006  . Smokeless tobacco: Never Used  Vaping Use  . Vaping Use: Never used  Substance Use Topics  . Alcohol use: Yes     Comment: occ  . Drug use: Not Currently    Types: Marijuana     Allergies   Apple and Other   Review of Systems As per HPI   Physical Exam Triage Vital Signs ED Triage Vitals  Enc Vitals Group     BP 09/28/20 1807 (!) 134/91     Pulse Rate 09/28/20 1807 96     Resp 09/28/20 1807 16     Temp 09/28/20 1807 98.2 F (36.8 C)     Temp Source 09/28/20 1807 Oral     SpO2 09/28/20 1807 99 %     Weight --      Height --      Head Circumference --      Peak Flow --      Pain Score 09/28/20 1825 3     Pain Loc --      Pain Edu? --      Excl. in [place]? --    No data found.  Updated Vital Signs BP (!) 134/91 (BP Location: Left Arm)   Pulse 96   Temp 98.2 F (36.8 C) (Oral)   Resp 16   LMP 09/18/2020   SpO2 99%   Visual Acuity Right Eye Distance:   Left Eye Distance:   Bilateral Distance:    Right Eye Near:   Left Eye Near:    Bilateral Near:     Physical Exam Constitutional:  General: She is not in acute distress. HENT:     Head: Normocephalic and atraumatic.     Right Ear: Ear canal normal.     Left Ear: Ear canal normal.     Ears:     Comments: Negative tragal tenderness bilaterally.  Mild TM retraction bilaterally with bony landmarks intact.    Nose:     Comments: Bilateral turbinate edema (R>L) with mucosal pallor Eyes:     General: No scleral icterus.    Pupils: Pupils are equal, round, and reactive to light.  Cardiovascular:     Rate and Rhythm: Normal rate.  Pulmonary:     Effort: Pulmonary effort is normal.  Abdominal:     General: Bowel sounds are normal.     Palpations: Abdomen is soft.     Tenderness: There is no abdominal tenderness. There is no right CVA tenderness, left CVA tenderness or guarding.  Musculoskeletal:     Comments: Low back unremarkable  Skin:    Coloration: Skin is not jaundiced or pale.     Findings: No rash.  Neurological:     Mental Status: She is alert and oriented to person, place, and time.      UC  Treatments / Results  Labs (all labs ordered are listed, but only abnormal results are displayed) Labs Reviewed  POCT URINALYSIS DIP (MANUAL ENTRY)    EKG   Radiology No results found.  Procedures Procedures (including critical care time)  Medications Ordered in UC Medications - No data to display  Initial Impression / Assessment and Plan / UC Course  I have reviewed the triage vital signs and the nursing notes.  Pertinent labs & imaging results that were available during my care of the patient were reviewed by me and considered in my medical decision making (see chart for details).     Urine unremarkable, will defer culture.  Provided urology contact information for further evaluation management if needed.  Will trial ibuprofen, pushing fluids, monitoring.  Patient to take allergy medication, intranasal steroid spray is bilateral otalgia likely second to eustachian tube dysfunction second to allergic rhinitis.  Return precautions discussed, pt verbalized understanding and is agreeable to plan. Final Clinical Impressions(s) / UC Diagnoses   Final diagnoses:  Post-nasal drip  Dysuria     Discharge Instructions     Ibuprofen 3 times a day. Flonase: 2 sprays in each nostril once daily. Daytime allergy medication once daily. Important to follow-up with urology for persistent or worsening urinary symptoms.    ED Prescriptions    None     PDMP not reviewed this encounter.   Neldon Mc [place], [place] 09/28/20 1848

## 2020-09-28 NOTE — ED Triage Notes (Signed)
Pt c/o burning on urination with dark urine and rt flank pain x1kw. Pt c/o post nasal drip with nasal congestion for the past 2 wks.

## 2020-09-28 NOTE — Discharge Instructions (Addendum)
Ibuprofen 3 times a day. Flonase: 2 sprays in each nostril once daily. Daytime allergy medication once daily. Important to follow-up with urology for persistent or worsening urinary symptoms.

## 2020-10-12 ENCOUNTER — Other Ambulatory Visit: Payer: Self-pay

## 2020-10-12 ENCOUNTER — Ambulatory Visit
Admission: EM | Admit: 2020-10-12 | Discharge: 2020-10-12 | Disposition: A | Discharge status: Home / Self Care | Payer: BC Managed Care – PPO | Attending: Internal Medicine | Admitting: Internal Medicine

## 2020-10-12 ENCOUNTER — Encounter: Payer: Self-pay | Admitting: Emergency Medicine

## 2020-10-12 DIAGNOSIS — H8302 Labyrinthitis, left ear: Secondary | ICD-10-CM

## 2020-10-12 MED ORDER — PREDNISONE 20 MG PO TABS: 20.0000 mg | ORAL_TABLET | Freq: Every day | ORAL | 0 refills | Status: AC

## 2020-10-12 NOTE — Discharge Instructions (Signed)
Please resume consistent usage of Flonase and Mucinex If you have worsening symptoms please make an appointment to see ENT specialist for further management.

## 2020-10-12 NOTE — ED Triage Notes (Signed)
Pt here for left ear fullness and ringing in ear x 2 weeks

## 2020-10-12 NOTE — ED Provider Notes (Signed)
[name]    CSN: [id number] Arrival date & time: 10/12/20  1449      History   Chief Complaint Chief Complaint  Patient presents with  . Otalgia    HPI Hannah Weber is a 30 y.o. female comes to the urgent care with 2-week history of left ear fullness and ringing in the left ear.  Symptoms started insidiously and has been persistent.  Patient denies any recent upper respiratory infection symptoms.  No trauma to the left ear.  Hearing is not muffled.  She denies any nausea or vomiting.  No headaches or spinning sensation.  Patient has been using Flonase and Mucinex inconsistently.   HPI  Past Medical History:  Diagnosis Date  . Allergy     Patient Active Problem List   Diagnosis Date Noted  . Hyperprolactinemia ([place]) 06/18/2020  . Ketonuria 01/12/2018  . Achilles tendinitis of both lower extremities 11/09/2017  . Smoker unmotivated to quit 09/07/2015    History reviewed. No pertinent surgical history.  OB History   No obstetric history on file.      Home Medications    Prior to Admission medications   Medication Sig Start Date End Date Taking? Authorizing Provider  predniSONE (DELTASONE) 20 MG tablet Take 1 tablet (20 mg total) by mouth daily for 5 days. 10/12/20 10/17/20 Yes Cherisse Carrell, Myrene Galas, MD  Norethindrone Acetate-Ethinyl Estrad-FE (LOESTRIN 24 FE) 1-20 MG-MCG(24) tablet Take 1 tablet by mouth daily. 06/17/20   Nicolette Bang, DO    Family History Family History  Problem Relation Age of Onset  . Diabetes Mother     Social History Social History   Tobacco Use  . Smoking status: Current Some Day Smoker    Packs/day: 0.10    Years: 15.00    Pack years: 1.50    Types: Cigarettes    Start date: 2006  . Smokeless tobacco: Never Used  Vaping Use  . Vaping Use: Never used  Substance Use Topics  . Alcohol use: Yes    Comment: occ  . Drug use: Not Currently    Types: Marijuana     Allergies   Apple and  Other   Review of Systems Review of Systems  HENT: Negative for ear discharge and ear pain.   Respiratory: Negative for cough and shortness of breath.   Gastrointestinal: Negative.   Neurological: Negative for dizziness, light-headedness and headaches.     Physical Exam Triage Vital Signs ED Triage Vitals  Enc Vitals Group     BP 10/12/20 1557 119/80     Pulse Rate 10/12/20 1557 82     Resp 10/12/20 1557 18     Temp 10/12/20 1557 98.5 F (36.9 C)     Temp Source 10/12/20 1557 Oral     SpO2 10/12/20 1557 99 %     Weight --      Height --      Head Circumference --      Peak Flow --      Pain Score 10/12/20 1603 0     Pain Loc --      Pain Edu? --      Excl. in [place]? --    No data found.  Updated Vital Signs BP 119/80   Pulse 82   Temp 98.5 F (36.9 C) (Oral)   Resp 18   LMP 09/18/2020   SpO2 99%   Visual Acuity Right Eye Distance:   Left Eye Distance:   Bilateral Distance:  Right Eye Near:   Left Eye Near:    Bilateral Near:     Physical Exam Vitals and nursing note reviewed.  Constitutional:      General: She is not in acute distress.    Appearance: She is not ill-appearing.  HENT:     Right Ear: Tympanic membrane normal.     Left Ear: Tympanic membrane normal.  Cardiovascular:     Rate and Rhythm: Normal rate.     Pulses: Normal pulses.     Heart sounds: Normal heart sounds.  Pulmonary:     Effort: Pulmonary effort is normal.     Breath sounds: Normal breath sounds.  Neurological:     Mental Status: She is alert.      UC Treatments / Results  Labs (all labs ordered are listed, but only abnormal results are displayed) Labs Reviewed - No data to display  EKG   Radiology No results found.  Procedures Procedures (including critical care time)  Medications Ordered in UC Medications - No data to display  Initial Impression / Assessment and Plan / UC Course  I have reviewed the triage vital signs and the nursing notes.  Pertinent  labs & imaging results that were available during my care of the patient were reviewed by me and considered in my medical decision making (see chart for details).     1.  Labyrinthitis of the left ear: Prednisone 20 mg orally daily for 5 days Continue Flonase and Mucinex use on a consistent basis If symptoms worsen, please make an appointment with ENT to be reevaluated. Final Clinical Impressions(s) / UC Diagnoses   Final diagnoses:  Labyrinthitis of left ear     Discharge Instructions     Please resume consistent usage of Flonase and Mucinex If you have worsening symptoms please make an appointment to see ENT specialist for further management.   ED Prescriptions    Medication Sig Dispense Auth. Provider   predniSONE (DELTASONE) 20 MG tablet Take 1 tablet (20 mg total) by mouth daily for 5 days. 5 tablet Enedina Pair, Myrene Galas, MD     PDMP not reviewed this encounter.   Chase Picket, MD 10/12/20 [phone]

## 2020-11-03 ENCOUNTER — Other Ambulatory Visit: Payer: Self-pay

## 2020-11-03 ENCOUNTER — Ambulatory Visit (INDEPENDENT_AMBULATORY_CARE_PROVIDER_SITE_OTHER): Payer: BC Managed Care – PPO | Admitting: Otolaryngology

## 2020-11-03 ENCOUNTER — Encounter (INDEPENDENT_AMBULATORY_CARE_PROVIDER_SITE_OTHER): Payer: Self-pay | Admitting: Otolaryngology

## 2020-11-03 VITALS — Temp 97.3°F

## 2020-11-03 DIAGNOSIS — H9313 Tinnitus, bilateral: Secondary | ICD-10-CM

## 2020-11-03 NOTE — Progress Notes (Signed)
HPI: Hannah Weber is a 30 y.o. female who returns today for evaluation of ringing in her ears especially on the left side that started back in December.  The right ear rings on and off of the left ear has been more constant.  Denies any loud noise exposure.  She had a previous hearing test with [place] in October that demonstrated normal hearing in both ears..  Past Medical History:  Diagnosis Date  . Allergy    No past surgical history on file. Social History   Socioeconomic History  . Marital status: Significant Other    Spouse name: Mertie Clause  . Number of children: Not on file  . Years of education: Not on file  . Highest education level: Not on file  Occupational History  . Occupation: customer service    Employer: [employer]  Tobacco Use  . Smoking status: Former Smoker    Packs/day: 0.10    Years: 15.00    Pack years: 1.50    Types: Cigarettes    Start date: 2006    Quit date: 10/02/2020    Years since quitting: 0.0  . Smokeless tobacco: Never Used  Vaping Use  . Vaping Use: Never used  Substance and Sexual Activity  . Alcohol use: Yes    Comment: occ  . Drug use: Not Currently    Types: Marijuana  . Sexual activity: Never    Birth control/protection: None  Other Topics Concern  . Not on file  Social History Narrative  . Not on file   Social Determinants of Health   Financial Resource Strain: Not on file  Food Insecurity: Not on file  Transportation Needs: Not on file  Physical Activity: Not on file  Stress: Not on file  Social Connections: Not on file   Family History  Problem Relation Age of Onset  . Diabetes Mother    Allergies  Allergen Reactions  . Apple   . Other     mangos   Prior to Admission medications   Medication Sig Start Date End Date Taking? Authorizing Provider  Norethindrone Acetate-Ethinyl Estrad-FE (LOESTRIN 24 FE) 1-20 MG-MCG(24) tablet Take 1 tablet by mouth daily. 06/17/20   Nicolette Bang, DO     Positive ROS: Otherwise  negative  All other systems have been reviewed and were otherwise negative with the exception of those mentioned in the HPI and as above.  Physical Exam: Constitutional: Alert, well-appearing, no acute distress Ears: External ears without lesions or tenderness. Ear canals are clear bilaterally.  Both TMs are clear bilaterally. Nasal: External nose without lesions. Septum with minimal deformity and mild rhinitis. Clear nasal passages otherwise. Oral: Lips and gums without lesions. Tongue and palate mucosa without lesions. Posterior oropharynx clear. Neck: No palpable adenopathy or masses Respiratory: Breathing comfortably  Skin: No facial/neck lesions or rash noted.  Audiologic testing in the office today demonstrated essentially normal hearing and symmetric hearing in both ears with SRT's of 15 dB bilaterally which is unchanged from previous hearing test 4 months ago.  Procedures  Assessment: Bilateral tinnitus worse on the left side  Plan: Reviewed the hearing test with the patient today which demonstrated normal hearing.  Discussed with her concerning using masking noise to help with the tinnitus and gave her some samples of Lipo flavonoid to try as this is beneficial in some patients with tinnitus. Also cautioned her about using ear protection when around any loud noise. She will follow-up as needed.   Radene Journey, MD

## 2020-11-08 ENCOUNTER — Encounter (INDEPENDENT_AMBULATORY_CARE_PROVIDER_SITE_OTHER): Payer: Self-pay

## 2020-11-17 ENCOUNTER — Ambulatory Visit
Admission: RE | Admit: 2020-11-17 | Discharge: 2020-11-17 | Disposition: A | Discharge status: Home / Self Care | Payer: BC Managed Care – PPO | Source: Ambulatory Visit | Attending: Student | Admitting: Student

## 2020-11-17 ENCOUNTER — Other Ambulatory Visit: Payer: Self-pay

## 2020-11-17 VITALS — BP 136/81 | HR 109 | Temp 98.3°F | Resp 18

## 2020-11-17 DIAGNOSIS — M2669 Other specified disorders of temporomandibular joint: Secondary | ICD-10-CM | POA: Diagnosis not present

## 2020-11-17 DIAGNOSIS — J3089 Other allergic rhinitis: Secondary | ICD-10-CM

## 2020-11-17 DIAGNOSIS — H6982 Other specified disorders of Eustachian tube, left ear: Secondary | ICD-10-CM

## 2020-11-17 DIAGNOSIS — R0982 Postnasal drip: Secondary | ICD-10-CM | POA: Diagnosis not present

## 2020-11-17 MED ORDER — PREDNISONE 20 MG PO TABS: 40.0000 mg | ORAL_TABLET | Freq: Every day | ORAL | 0 refills | Status: AC

## 2020-11-17 NOTE — Discharge Instructions (Signed)
-  I sent a prescription for prednisone.  Try this, 2 pills daily taken together in the morning, for 5 days.  This can give you energy, so do take this in the morning. -You could also try Benadryl at night for 7 days.  See if this gives you an improvement in your postnasal drip and ear pain. -Seek additional medical attention if you experience worsening of symptoms, hearing changes, dizziness, fever/chills, new URI symptoms like cough, etc.

## 2020-11-17 NOTE — ED Triage Notes (Signed)
Pt c/o lt ear pain/ringing and post nasal drip x 2 months. States has seen an ENT. States was out of work for this today and needs to be cleared to go back to work. Pt states no changes, same sx's.

## 2020-11-17 NOTE — ED Provider Notes (Addendum)
[name]    CSN: [id number] Arrival date & time: 11/17/20  1556      History   Chief Complaint Chief Complaint  Patient presents with  . Otalgia  . appt 4    HPI Hannah Weber is a 30 y.o. female presenting with left ear pressure and ringing. She has a long history of similar, and has been followed by urgent care and ENT for similar multiple times in the last few months. Today complaining of left ear pian and ringing, postnasal drip x2 months. Denies changes in symptoms; adamant that these are chronic symptoms that have persisted 2+ months.  work requiring clearance to return.  HPI  Past Medical History:  Diagnosis Date  . Allergy     Patient Active Problem List   Diagnosis Date Noted  . Hyperprolactinemia ([place]) 06/18/2020  . Ketonuria 01/12/2018  . Achilles tendinitis of both lower extremities 11/09/2017  . Smoker unmotivated to quit 09/07/2015    History reviewed. No pertinent surgical history.  OB History   No obstetric history on file.      Home Medications    Prior to Admission medications   Medication Sig Start Date End Date Taking? Authorizing Provider  predniSONE (DELTASONE) 20 MG tablet Take 2 tablets (40 mg total) by mouth daily for 5 days. 11/17/20 11/22/20 Yes Hazel Sams, PA-C  Norethindrone Acetate-Ethinyl Estrad-FE (LOESTRIN 24 FE) 1-20 MG-MCG(24) tablet Take 1 tablet by mouth daily. 06/17/20   Nicolette Bang, DO    Family History Family History  Problem Relation Age of Onset  . Diabetes Mother     Social History Social History   Tobacco Use  . Smoking status: Former Smoker    Packs/day: 0.10    Years: 15.00    Pack years: 1.50    Types: Cigarettes    Start date: 2006    Quit date: 10/02/2020    Years since quitting: 0.1  . Smokeless tobacco: Never Used  Vaping Use  . Vaping Use: Never used  Substance Use Topics  . Alcohol use: Yes    Comment: occ  . Drug use: Not Currently    Types: Marijuana      Allergies   Apple and Other   Review of Systems Review of Systems  Constitutional: Negative for appetite change, chills and fever.  HENT: Positive for ear pain and tinnitus. Negative for congestion, rhinorrhea, sinus pressure, sinus pain and sore throat.   Eyes: Negative for redness and visual disturbance.  Respiratory: Negative for cough, chest tightness, shortness of breath and wheezing.   Cardiovascular: Negative for chest pain and palpitations.  Gastrointestinal: Negative for abdominal pain, constipation, diarrhea, nausea and vomiting.  Genitourinary: Negative for dysuria, frequency and urgency.  Musculoskeletal: Negative for myalgias.  Neurological: Negative for dizziness, weakness and headaches.  Psychiatric/Behavioral: Negative for confusion.  All other systems reviewed and are negative.    Physical Exam Triage Vital Signs ED Triage Vitals  Enc Vitals Group     BP 11/17/20 1611 136/81     Pulse Rate 11/17/20 1611 (!) 109     Resp 11/17/20 1611 18     Temp 11/17/20 1611 98.3 F (36.8 C)     Temp Source 11/17/20 1611 Oral     SpO2 11/17/20 1611 98 %     Weight --      Height --      Head Circumference --      Peak Flow --      Pain Score  11/17/20 1612 4     Pain Loc --      Pain Edu? --      Excl. in [place]? --    No data found.  Updated Vital Signs BP 136/81 (BP Location: Left Arm)   Pulse (!) 109   Temp 98.3 F (36.8 C) (Oral)   Resp 18   LMP 10/16/2020   SpO2 98%   Visual Acuity Right Eye Distance:   Left Eye Distance:   Bilateral Distance:    Right Eye Near:   Left Eye Near:    Bilateral Near:     Physical Exam Vitals reviewed.  Constitutional:      General: She is not in acute distress.    Appearance: Normal appearance. She is not ill-appearing.  HENT:     Head: Normocephalic and atraumatic.     Comments: Crepitus L TMJ    Right Ear: Hearing, tympanic membrane, ear canal and external ear normal. No swelling or tenderness. There is no  impacted cerumen. No mastoid tenderness. Tympanic membrane is not perforated, erythematous, retracted or bulging.     Left Ear: Hearing, ear canal and external ear normal. No swelling or tenderness. A middle ear effusion is present. There is no impacted cerumen. No mastoid tenderness. Tympanic membrane is not perforated, erythematous, retracted or bulging.     Nose:     Right Sinus: No maxillary sinus tenderness or frontal sinus tenderness.     Left Sinus: No maxillary sinus tenderness or frontal sinus tenderness.     Mouth/Throat:     Mouth: Mucous membranes are moist.     Pharynx: Uvula midline. No oropharyngeal exudate or posterior oropharyngeal erythema.     Tonsils: No tonsillar exudate.  Cardiovascular:     Rate and Rhythm: Normal rate and regular rhythm.     Heart sounds: Normal heart sounds.  Pulmonary:     Breath sounds: Normal breath sounds and air entry. No wheezing, rhonchi or rales.  Chest:     Chest wall: No tenderness.  Abdominal:     General: Abdomen is flat. Bowel sounds are normal.     Tenderness: There is no abdominal tenderness. There is no guarding or rebound.  Lymphadenopathy:     Cervical: No cervical adenopathy.  Neurological:     General: No focal deficit present.     Mental Status: She is alert and oriented to person, place, and time.  Psychiatric:        Attention and Perception: Attention and perception normal.        Mood and Affect: Mood and affect normal.        Behavior: Behavior normal. Behavior is cooperative.        Thought Content: Thought content normal.        Judgment: Judgment normal.      UC Treatments / Results  Labs (all labs ordered are listed, but only abnormal results are displayed) Labs Reviewed - No data to display  EKG   Radiology No results found.  Procedures Procedures (including critical care time)  Medications Ordered in UC Medications - No data to display  Initial Impression / Assessment and Plan / UC Course  I  have reviewed the triage vital signs and the nursing notes.  Pertinent labs & imaging results that were available during my care of the patient were reviewed by me and considered in my medical decision making (see chart for details).      This patient is a 30 year old female presenting with  chronic eustachian tube dysfunction. She has been evaluated for this by [place] and ENT multiple times in the last 3 months.   She states that a course of prednisone provided her with some relief in the past and is interested in trying this again. She is not a diabetic; sent as below.   For allergic rhinitis- she has failed trial of multiple antihistamines including Claritin, Zyrtec, and also Flonase. Rec trial of Benedryl taken at night.   She denies changes in symptoms and declines covid test today; I am in agreement that considering the chronic and unchanged nature of her symptoms this is not necessary at this time. Continue wearing mask in public as per CDC guidelines.   Spent over 40 minutes obtaining H&P, performing physical, discussing results, treatment plan and plan for follow-up with patient. Patient agrees with plan.   This chart was dictated using voice recognition software, Dragon. Despite the best efforts of this provider to proofread and correct errors, errors may still occur which can change documentation meaning.   Final Clinical Impressions(s) / UC Diagnoses   Final diagnoses:  Dysfunction of left eustachian tube  TMJ crepitus  Seasonal allergic rhinitis due to other allergic trigger  PND (post-nasal drip)     Discharge Instructions     -I sent a prescription for prednisone.  Try this, 2 pills daily taken together in the morning, for 5 days.  This can give you energy, so do take this in the morning. -You could also try Benadryl at night for 7 days.  See if this gives you an improvement in your postnasal drip and ear pain. -Seek additional medical attention if you experience worsening  of symptoms, hearing changes, dizziness, fever/chills, new URI symptoms like cough, etc.    ED Prescriptions    Medication Sig Dispense Auth. Provider   predniSONE (DELTASONE) 20 MG tablet Take 2 tablets (40 mg total) by mouth daily for 5 days. 10 tablet Hazel Sams, PA-C     PDMP not reviewed this encounter.   Hazel Sams, PA-C 11/17/20 1646    Hazel Sams, PA-C 11/17/20 1651

## 2020-11-18 DIAGNOSIS — Z20822 Contact with and (suspected) exposure to covid-19: Secondary | ICD-10-CM | POA: Diagnosis not present

## 2020-12-21 ENCOUNTER — Ambulatory Visit
Admission: EM | Admit: 2020-12-21 | Discharge: 2020-12-21 | Disposition: A | Discharge status: Home / Self Care | Payer: BC Managed Care – PPO | Attending: Emergency Medicine | Admitting: Emergency Medicine

## 2020-12-21 ENCOUNTER — Other Ambulatory Visit: Payer: Self-pay

## 2020-12-21 ENCOUNTER — Encounter: Payer: Self-pay | Admitting: Emergency Medicine

## 2020-12-21 DIAGNOSIS — H6983 Other specified disorders of Eustachian tube, bilateral: Secondary | ICD-10-CM

## 2020-12-21 DIAGNOSIS — Z20822 Contact with and (suspected) exposure to covid-19: Secondary | ICD-10-CM | POA: Diagnosis not present

## 2020-12-21 MED ORDER — PREDNISONE 10 MG (21) PO TBPK: ORAL_TABLET | ORAL | 0 refills | Status: DC

## 2020-12-21 MED ORDER — FLUTICASONE PROPIONATE 50 MCG/ACT NA SUSP
2.0000 | Freq: Every day | NASAL | 0 refills | Status: DC
Start: 2020-12-21 — End: 2022-01-30

## 2020-12-21 NOTE — ED Provider Notes (Signed)
HPI  SUBJECTIVE:  Hannah Weber is a 30 y.o. female who presents with 3 days of bilateral ear stuffiness/pain, nasal congestion, rhinorrhea, occasional sneezing.  She states that her ears are popping and cracking.  No change in hearing, vertigo, sinus pain or pressure, sore throat, itchy watery eyes.  No aggravating or alleviating factors.  She has not tried anything for this.  She also reports tinnitus since December that has not changed today.  She has had ENT evaluation for this, was told that she had fluid behind her ears, and was  placed on Flonase, prednisone without much improvement in her symptoms.  She has a past medical history of seasonal allergies/rhinitis, eustachian tube dysfunction, labyrinthitis.  No history of diabetes, hypertension or frequent otitis media.  LMP: Now.  Denies possibility being pregnant.  [id number], Bayard Beaver, DO  Past Medical History:  Diagnosis Date  . Allergy     History reviewed. No pertinent surgical history.  Family History  Problem Relation Age of Onset  . Diabetes Mother     Social History   Tobacco Use  . Smoking status: Former Smoker    Packs/day: 0.10    Years: 15.00    Pack years: 1.50    Types: Cigarettes    Start date: 2006    Quit date: 10/02/2020    Years since quitting: 0.2  . Smokeless tobacco: Never Used  Vaping Use  . Vaping Use: Never used  Substance Use Topics  . Alcohol use: Yes    Comment: occ  . Drug use: Not Currently    Types: Marijuana    No current facility-administered medications for this encounter.  Current Outpatient Medications:  .  fluticasone (FLONASE) 50 MCG/ACT nasal spray, Place 2 sprays into both nostrils daily., Disp: 16 g, Rfl: 0 .  predniSONE (STERAPRED UNI-PAK 21 TAB) 10 MG (21) TBPK tablet, Dispense one 6 day pack. Take as directed with food., Disp: 21 tablet, Rfl: 0 .  Norethindrone Acetate-Ethinyl Estrad-FE (LOESTRIN 24 FE) 1-20 MG-MCG(24) tablet, Take 1 tablet by mouth daily., Disp:  28 tablet, Rfl: 11  Allergies  Allergen Reactions  . Apple   . Other     mangos     ROS  As noted in HPI.   Physical Exam  BP 122/85 (BP Location: Left Arm)   Pulse 81   Temp 98.1 F (36.7 C) (Oral)   Resp 20   SpO2 98%   Constitutional: Well developed, well nourished, no acute distress Eyes:  EOMI, conjunctiva normal bilaterally HENT: Normocephalic, atraumatic,mucus membranes moist.  Positive nasal congestion.  Erythematous, swollen turbinates.  No maxillary, frontal sinus tenderness.  Normal TMs bilaterally.  No fluid behind the TMs Respiratory: Normal inspiratory effort Cardiovascular: Normal rate GI: nondistended skin: No rash, skin intact Musculoskeletal: no deformities Neurologic: Alert & oriented x 3, no focal neuro deficits Psychiatric: Speech and behavior appropriate   ED Course   Medications - No data to display  No orders of the defined types were placed in this encounter.   No results found for this or any previous visit (from the past 24 hour(s)). No results found.  ED Clinical Impression  1. Eustachian tube dysfunction, bilateral      ED Assessment/Plan  The tinnitus is not new nor has it changed.  What is new is the ear fullness.  Suspect eustachian tube dysfunction secondary to allergies/allergic rhinitis.  Will send home with saline nasal irrigation, she is to restart her Flonase, start antihistamine such as Claritin Allegra  or Zyrtec, switch to antihistamine/decongestant combination if nasal congestion becomes a problem, will send home with a prednisone taper, follow-up with PMD or ENT if no better.  Discussed  MDM, treatment plan, and plan for follow-up with patient. patient agrees with plan.   Meds ordered this encounter  Medications  . predniSONE (STERAPRED UNI-PAK 21 TAB) 10 MG (21) TBPK tablet    Sig: Dispense one 6 day pack. Take as directed with food.    Dispense:  21 tablet    Refill:  0  . fluticasone (FLONASE) 50 MCG/ACT nasal  spray    Sig: Place 2 sprays into both nostrils daily.    Dispense:  16 g    Refill:  0    *This clinic note was created using Lobbyist. Therefore, there may be occasional mistakes despite careful proofreading.   ?    Melynda Ripple, MD 12/22/20 [phone]

## 2020-12-21 NOTE — ED Triage Notes (Signed)
Pt here for bilateral ear pain and pressure with left worse than right x several days

## 2020-12-21 NOTE — Discharge Instructions (Addendum)
Try saline nasal irrigation with a Milta Deiters Med rinse and distilled water as often as you want.  Restart your Flonase.  Start antihistamine such as Claritin, Allegra, or Zyrtec.  If nasal congestion becomes a problem, then switch to an antihistamine decongestant combination such as Claritin-D, Allegra-D, or Zyrtec-D.  Finish prednisone taper unless a provider tells you to stop.  Follow-up with your doctor with your ear nose throat doctor if this does not work.

## 2020-12-27 ENCOUNTER — Telehealth: Payer: Self-pay | Admitting: Internal Medicine

## 2020-12-27 NOTE — Telephone Encounter (Signed)
New message    Prior authorization for upcoming MRI needs before 12 noon on Wednesday Appt on  3.31.22 @ 5:20 pm   Previously prior authorization expires in Nov of last year.

## 2020-12-28 ENCOUNTER — Emergency Department (HOSPITAL_COMMUNITY): Payer: BC Managed Care – PPO

## 2020-12-28 ENCOUNTER — Other Ambulatory Visit: Payer: Self-pay

## 2020-12-28 ENCOUNTER — Encounter (HOSPITAL_COMMUNITY): Payer: Self-pay

## 2020-12-28 ENCOUNTER — Emergency Department (HOSPITAL_COMMUNITY)
Admission: EM | Admit: 2020-12-28 | Discharge: 2020-12-28 | Disposition: A | Discharge status: Home / Self Care | Payer: BC Managed Care – PPO | Attending: Emergency Medicine | Admitting: Emergency Medicine

## 2020-12-28 DIAGNOSIS — Z87891 Personal history of nicotine dependence: Secondary | ICD-10-CM | POA: Insufficient documentation

## 2020-12-28 DIAGNOSIS — R42 Dizziness and giddiness: Secondary | ICD-10-CM | POA: Diagnosis not present

## 2020-12-28 DIAGNOSIS — J322 Chronic ethmoidal sinusitis: Secondary | ICD-10-CM | POA: Diagnosis not present

## 2020-12-28 DIAGNOSIS — W228XXA Striking against or struck by other objects, initial encounter: Secondary | ICD-10-CM | POA: Diagnosis not present

## 2020-12-28 DIAGNOSIS — J0120 Acute ethmoidal sinusitis, unspecified: Secondary | ICD-10-CM | POA: Diagnosis not present

## 2020-12-28 DIAGNOSIS — J3489 Other specified disorders of nose and nasal sinuses: Secondary | ICD-10-CM | POA: Diagnosis not present

## 2020-12-28 DIAGNOSIS — S0990XA Unspecified injury of head, initial encounter: Secondary | ICD-10-CM | POA: Diagnosis not present

## 2020-12-28 LAB — PREGNANCY, URINE: Preg Test, Ur: NEGATIVE

## 2020-12-28 NOTE — ED Triage Notes (Addendum)
Patient reports that she hit her head on the trunk of the car 3 days ago and is now having slight dizziness.  Patient c/o bilateral ear pain and ringing x 3 months

## 2020-12-28 NOTE — Discharge Instructions (Addendum)
You were provided with a copy of your CT head.  You may continue following up with ENT for further agement of bilateral ear fullness.  Experience any vomiting, worsening symptoms please return to the emergency room.

## 2020-12-28 NOTE — ED Provider Notes (Signed)
[facility] DEPT Provider Note   CSN: [id number] Arrival date & time: 12/28/20  1525     History Chief Complaint  Patient presents with  . Head Injury  . Otalgia    Hannah Weber is a 30 y.o. female.  30 y.o female with a PMH of Hyperprolactinemia presents to the ED with chief complaint of headache, dizziness.  Patient reports she was closing the trunk of her car when she suddenly hit the front of her head with the trunk.  Reports being dizzy at the time.  States that she has been dizzy for several months, she thought this was likely related to the eustachian tube dysfunction, she has been given prednisone, medication to treat this about a week ago by urgent care without much improvement.  She was previously evaluated by ENT, Dr. Lucia Gaskins, who saw her for eustachian tube dysfunction.  She reports no improvement in her dizziness despite treatment.  She does report the dizziness occurs when she goes from sitting to standing, this lasted approximately 5 seconds, occurs on a daily basis.  She does not of any prior history of vertigo.  Currently takes no medication to help with these symptoms.  Denies any blood thinner use, vomiting, nausea, focal weakness.  She denies any neck pain, fever, other complaints.  The history is provided by the patient and medical records.  Head Injury Associated symptoms: no nausea and no vomiting   Otalgia Associated symptoms: no abdominal pain, no fever and no vomiting        Past Medical History:  Diagnosis Date  . Allergy     Patient Active Problem List   Diagnosis Date Noted  . Hyperprolactinemia ([place]) 06/18/2020  . Ketonuria 01/12/2018  . Achilles tendinitis of both lower extremities 11/09/2017  . Smoker unmotivated to quit 09/07/2015    History reviewed. No pertinent surgical history.   OB History   No obstetric history on file.     Family History  Problem Relation Age of Onset  . Diabetes Mother      Social History   Tobacco Use  . Smoking status: Former Smoker    Packs/day: 0.10    Years: 15.00    Pack years: 1.50    Types: Cigarettes    Start date: 2006    Quit date: 10/02/2020    Years since quitting: 0.2  . Smokeless tobacco: Never Used  Vaping Use  . Vaping Use: Never used  Substance Use Topics  . Alcohol use: Yes    Comment: occ  . Drug use: Yes    Types: Marijuana    Home Medications Prior to Admission medications   Medication Sig Start Date End Date Taking? Authorizing Provider  acetaminophen (TYLENOL) 500 MG tablet Take 500 mg by mouth every 6 (six) hours as needed for moderate pain.   Yes [provider]  fluticasone (FLONASE) 50 MCG/ACT nasal spray Place 2 sprays into both nostrils daily. 12/21/20  Yes Melynda Ripple, MD  Olopatadine HCl (PATADAY OP) Place 1 drop into both eyes daily as needed (itching).   Yes [provider]  predniSONE (STERAPRED UNI-PAK 21 TAB) 10 MG (21) TBPK tablet Dispense one 6 day pack. Take as directed with food. Patient taking differently: Take 10 mg by mouth See admin instructions. 6 day pack. Take as directed with food. 12/21/20  Yes Melynda Ripple, MD  Norethindrone Acetate-Ethinyl Estrad-FE (LOESTRIN 24 FE) 1-20 MG-MCG(24) tablet Take 1 tablet by mouth daily. Patient not taking: No sig reported  06/17/20   Nicolette Bang, DO    Allergies    Apple and Other  Review of Systems   Review of Systems  Constitutional: Negative for fever.  HENT: Positive for ear pain.   Eyes: Negative for photophobia and visual disturbance.  Respiratory: Negative for shortness of breath.   Cardiovascular: Negative for chest pain.  Gastrointestinal: Negative for abdominal pain, nausea and vomiting.  Genitourinary: Negative for flank pain.  Musculoskeletal: Negative for back pain.  Neurological: Positive for dizziness.  All other systems reviewed and are negative.   Physical Exam Updated Vital Signs BP (!)  142/92 (BP Location: Right Arm)   Pulse 100   Temp 98 F (36.7 C) (Oral)   Resp 17   Ht 5\' 8"  (1.727 m)   Wt 102.1 kg   LMP 12/25/2020   SpO2 100%   BMI 34.21 kg/m   Physical Exam Vitals and nursing note reviewed.  Constitutional:      Appearance: Normal appearance. She is not ill-appearing.  HENT:     Head: Normocephalic and atraumatic.     Right Ear: Tympanic membrane normal. There is no impacted cerumen.     Left Ear: Tympanic membrane normal. There is no impacted cerumen.     Nose: Nose normal.     Mouth/Throat:     Mouth: Mucous membranes are moist.  Eyes:     General: No scleral icterus.    Pupils: Pupils are equal, round, and reactive to light.  Cardiovascular:     Rate and Rhythm: Normal rate.  Pulmonary:     Effort: Pulmonary effort is normal.  Abdominal:     General: Abdomen is flat.  Musculoskeletal:     Cervical back: Normal range of motion and neck supple.  Skin:    General: Skin is warm and dry.  Neurological:     Mental Status: She is alert and oriented to person, place, and time.     Comments: Alert, oriented, thought content appropriate. Speech fluent without evidence of aphasia. Able to follow 2 step commands without difficulty.  Cranial Nerves:  II:  Peripheral visual fields grossly normal, pupils, round, reactive to light III,IV, VI: ptosis not present, extra-ocular motions intact bilaterally  V,VII: smile symmetric, facial light touch sensation equal VIII: hearing grossly normal bilaterally  IX,X: midline uvula rise  XI: bilateral shoulder shrug equal and strong XII: midline tongue extension  Motor:  5/5 in upper and lower extremities bilaterally including strong and equal grip strength and dorsiflexion/plantar flexion Sensory: light touch normal in all extremities.  Cerebellar: normal finger-to-nose with bilateral upper extremities, pronator drift negative Gait: normal gait and balance      ED Results / Procedures / Treatments    Labs (all labs ordered are listed, but only abnormal results are displayed) Labs Reviewed  PREGNANCY, URINE    EKG None  Radiology CT Head Wo Contrast  Result Date: 12/28/2020 CLINICAL DATA:  Head trauma, minor, normal mental status. Additional history provided: Patient reports ringing in bilateral ears for the past 3 months. EXAM: CT HEAD WITHOUT CONTRAST TECHNIQUE: Contiguous axial images were obtained from the base of the skull through the vertex without intravenous contrast. COMPARISON:  Head CT 06/02/2020 FINDINGS: Brain: Cerebral volume is normal. There is no acute intracranial hemorrhage. No demarcated cortical infarct. No extra-axial fluid collection. No evidence of intracranial mass. No midline shift. Vascular: No hyperdense vessel. Skull: Normal. Negative for fracture or focal lesion. Sinuses/Orbits: Visualized orbits show no acute finding. Trace scattered bilateral ethmoid  sinus mucosal thickening at the imaged levels. Other: No significant mastoid effusion. IMPRESSION: No evidence of acute intracranial abnormality. Minimal bilateral ethmoid sinus mucosal thickening. Electronically Signed   By: Kellie Simmering DO   On: 12/28/2020 17:39    Procedures Procedures   Medications Ordered in ED Medications - No data to display  ED Course  I have reviewed the triage vital signs and the nursing notes.  Pertinent labs & imaging results that were available during my care of the patient were reviewed by me and considered in my medical decision making (see chart for details).  Clinical Course as of 12/28/20 1757  Tue Dec 28, 2020  1753 Preg Test, Ur: NEGATIVE [JS]    Clinical Course User Index [JS] Janeece Fitting, PA-C   MDM Rules/Calculators/A&P     Patient arrived in the ED with headache, dizziness, presents to the ED with a chief complaint of headache, dizziness which has been ongoing for the past 3 months.  Does report trauma to her head about 3 days ago which included hitting the  front aspect of her head with the trunk of a car.  There has been no nausea,no focal weakness, changes in vision, syncopal episode.  During evaluation patient is overall well-appearing, she voices concern for "likely a tumor, that something might be wrong which is making me very anxious and I had to miss work ".  We discussed that she will likely need an MRI if she is concerned about the prolactinoma that she previously had.  We discussed risks of radiation with CT imaging, she is agreeable of obtaining CT head at this time.  Neurological exam is intact on my evaluation.  Her vitals are within normal limits, there is no fever, no neck pain, no limited range of motion to suggest meningitis.  CT head without any intracranial pathology, no intracranial hemorrhage or intracranial mass.  He was provided with a copy of her CT head in order to follow-up with PCP along with ENT.  Bilateral ears do appear with unremarkable TMs, she is followed by Dr. Lucia Gaskins for eustachian tube dysfunction, was recently placed on steroids but she feels like she has not improved.  We discussed she will likely need to continue to follow-up with him if there is no signs of infection at this time.  According to Dr. Pollie Friar note, from her chart review, shows that she had a normal exam with him earlier in the year.  Patient understands and agrees with management, return precautions discussed at length.  Patient stable for discharge.  Portions of this note were generated with Lobbyist. Dictation errors may occur despite best attempts at proofreading.  Final Clinical Impression(s) / ED Diagnoses Final diagnoses:  Injury of head, initial encounter  Dizziness    Rx / DC Orders ED Discharge Orders    None       Janeece Fitting, PA-C 12/28/20 1757    Malvin Johns, MD 12/28/20 1907

## 2020-12-28 NOTE — ED Notes (Signed)
An After Visit Summary was printed and given to the patient. Discharge instructions given and no further questions at this time.  

## 2020-12-28 NOTE — ED Notes (Signed)
Pt aware urine sample is needed 

## 2020-12-29 ENCOUNTER — Telehealth: Payer: Self-pay

## 2020-12-29 NOTE — Telephone Encounter (Signed)
Transition Care Management Unsuccessful Follow-up Telephone Call  Date of discharge and from where:  12/28/2020 from [facility]  Attempts:  1st Attempt  Reason for unsuccessful TCM follow-up call:  Left voice message

## 2020-12-29 NOTE — Telephone Encounter (Signed)
Transition Care Management Follow-up Telephone Call  Date of discharge and from where: 12/28/2020 from [facility]   How have you been since you were released from the hospital? Pt stated that she is feeling well and has no questions or concerns.   Any questions or concerns? No  Items Reviewed:  Did the pt receive and understand the discharge instructions provided? Yes   Medications obtained and verified? Yes   Other? No   Any new allergies since your discharge? No   Dietary orders reviewed? n/a  Do you have support at home? Yes   Functional Questionnaire: (I = Independent and D = Dependent) ADLs: I  Bathing/Dressing- I  Meal Prep- I  Eating- I  Maintaining continence- I  Transferring/Ambulation- I  Managing Meds- I  Follow up appointments reviewed:   PCP Hospital f/u appt confirmed? No    Specialist Hospital f/u appt confirmed? No    Are transportation arrangements needed? No   If their condition worsens, is the pt aware to call PCP or go to the Emergency Dept.? Yes  Was the patient provided with contact information for the PCP's office or ED? Yes  Was to pt encouraged to call back with questions or concerns? Yes

## 2020-12-30 ENCOUNTER — Other Ambulatory Visit: Payer: BC Managed Care – PPO

## 2021-01-04 NOTE — Telephone Encounter (Signed)
Prior Authorization approved: PA number is [id number]  Scheduled with [facility]- [phone] CPT- 23414 Dx- E22.1 For elevated lab test- prolactin Approved through 01/04/2021 through 5/41/2022

## 2021-01-13 ENCOUNTER — Other Ambulatory Visit: Payer: Self-pay

## 2021-01-13 ENCOUNTER — Telehealth: Payer: Self-pay | Admitting: Internal Medicine

## 2021-01-13 ENCOUNTER — Ambulatory Visit
Admission: RE | Admit: 2021-01-13 | Discharge: 2021-01-13 | Disposition: A | Discharge status: Home / Self Care | Payer: BC Managed Care – PPO | Source: Ambulatory Visit | Attending: Internal Medicine | Admitting: Internal Medicine

## 2021-01-13 DIAGNOSIS — E221 Hyperprolactinemia: Secondary | ICD-10-CM | POA: Diagnosis not present

## 2021-01-13 DIAGNOSIS — J341 Cyst and mucocele of nose and nasal sinus: Secondary | ICD-10-CM | POA: Diagnosis not present

## 2021-01-13 NOTE — Telephone Encounter (Signed)
Copied from [place] [id number]. Topic: General - Other >> Jan 13, 2021  9:27 AM Leward Quan A wrote: Reason for CRM: T with [facility] called in needing to speak to [facility] need clarification on orders for this patient can be reached at Ph# [phone].

## 2021-01-15 ENCOUNTER — Other Ambulatory Visit: Payer: Self-pay | Admitting: Family

## 2021-01-15 DIAGNOSIS — E221 Hyperprolactinemia: Secondary | ICD-10-CM

## 2021-01-15 NOTE — Progress Notes (Signed)
01/13/2021 per RT note: MR Brain w/o Brain mass or lesion; elevated prolactin, evaluate for prolactinoma  Five attempts to call and verify order for no contrast and one attempt to Epic message provider with no success.   01/15/2021: Updated MRI Brain W WO Contrast

## 2021-01-17 NOTE — Telephone Encounter (Signed)
Writer was not in the day that call was routed.  Called facility, [facility] to f/u.  Spoke to representative at number listed and states that the study was completed.  Will route message to PCP. There was some some modifications noted and requested for the study.

## 2021-02-02 ENCOUNTER — Other Ambulatory Visit: Payer: Self-pay

## 2021-02-02 ENCOUNTER — Other Ambulatory Visit: Payer: Self-pay | Admitting: Internal Medicine

## 2021-02-02 ENCOUNTER — Encounter: Payer: Self-pay | Admitting: Internal Medicine

## 2021-02-02 ENCOUNTER — Ambulatory Visit (INDEPENDENT_AMBULATORY_CARE_PROVIDER_SITE_OTHER): Payer: BC Managed Care – PPO | Admitting: Internal Medicine

## 2021-02-02 VITALS — BP 124/87 | HR 102 | Resp 16 | Wt 228.6 lb

## 2021-02-02 DIAGNOSIS — H9312 Tinnitus, left ear: Secondary | ICD-10-CM

## 2021-02-02 DIAGNOSIS — H9202 Otalgia, left ear: Secondary | ICD-10-CM | POA: Diagnosis not present

## 2021-02-02 MED ORDER — AMOXICILLIN 875 MG PO TABS: 875.0000 mg | ORAL_TABLET | Freq: Two times a day (BID) | ORAL | 0 refills | Status: AC

## 2021-02-02 MED ORDER — PREDNISONE 10 MG (21) PO TBPK: ORAL_TABLET | ORAL | 0 refills | Status: DC

## 2021-02-02 NOTE — Progress Notes (Signed)
  Subjective:    Hannah Weber - 30 y.o. female MRN [id number]  Date of birth: July 12, 1991  HPI  Hannah Weber is here for left ear pain/fullness. This has been a chronic issue for the past several months. Also reports new onset left sided tinnitus since December. Has been seen by ENT x2 without any solution. Has been seen at [facility] multiple times for this complaint. Thought to be related to allergic rhinitis and eustachian tube dysfunction. TMJ may also be contributing. Finished Prednisone about 2 weeks ago. Reports was feeling better but then symptoms returned. She uses Flonase on average every other day. Afebrile.      Health Maintenance:  Health Maintenance Due  Topic Date Due  . COVID-19 Vaccine (1) Never done    -  reports that she quit smoking about 4 months ago. Her smoking use included cigarettes. She started smoking about 16 years ago. She has a 1.50 pack-year smoking history. She has never used smokeless tobacco. - Review of Systems: Per HPI. - Past Medical History: Patient Active Problem List   Diagnosis Date Noted  . Hyperprolactinemia ([place]) 06/18/2020  . Ketonuria 01/12/2018  . Achilles tendinitis of both lower extremities 11/09/2017  . Smoker unmotivated to quit 09/07/2015   - Medications: reviewed and updated   Objective:   Physical Exam BP 124/87   Pulse (!) 102   Resp 16   Wt 228 lb 9.6 oz (103.7 kg)   SpO2 96%   BMI 34.76 kg/m  Physical Exam Constitutional:      General: She is not in acute distress.    Appearance: She is not diaphoretic.  HENT:     Right Ear: Tympanic membrane, ear canal and external ear normal. There is no impacted cerumen.     Left Ear: Ear canal and external ear normal. There is no impacted cerumen.     Ears:     Comments: Left TM appears to be bulging and slightly erythematous.  Cardiovascular:     Rate and Rhythm: Normal rate.  Pulmonary:     Effort: Pulmonary effort is normal. No respiratory distress.  Musculoskeletal:         General: Normal range of motion.  Skin:    General: Skin is warm and dry.  Neurological:     Mental Status: She is alert and oriented to person, place, and time.  Psychiatric:        Mood and Affect: Affect normal.        Judgment: Judgment normal.            Assessment & Plan:   1. Left ear pain Questionable secondary AOM present on exam. Therefore, will treat with Amoxicillin. Will refill Prednisone. Discussed should be using Flonase daily for maximum benefit. Will place referral to ENT for second opinion given chronicity with multiple appointments and no relief of symptoms.  - predniSONE (STERAPRED UNI-PAK 21 TAB) 10 MG (21) TBPK tablet; Dispense one 6 day pack. Take as directed with food.  Dispense: 21 tablet; Refill: 0 - amoxicillin (AMOXIL) 875 MG tablet; Take 1 tablet (875 mg total) by mouth 2 (two) times daily for 10 days.  Dispense: 14 tablet; Refill: 0 - Ambulatory referral to ENT  2. Tinnitus of left ear - Ambulatory referral to ENT   Phill Myron, D.O. 02/02/2021, 3:37 PM Primary Care at [facility]

## 2021-02-02 NOTE — Progress Notes (Signed)
Pt states she has been taking Flonase and prednisone and it has helped a little bit   Pt states her ears feels clogged up up

## 2021-03-10 DIAGNOSIS — H9313 Tinnitus, bilateral: Secondary | ICD-10-CM | POA: Insufficient documentation

## 2021-03-10 DIAGNOSIS — M26609 Unspecified temporomandibular joint disorder, unspecified side: Secondary | ICD-10-CM | POA: Diagnosis not present

## 2021-03-10 DIAGNOSIS — H9202 Otalgia, left ear: Secondary | ICD-10-CM | POA: Insufficient documentation

## 2021-04-12 DIAGNOSIS — F411 Generalized anxiety disorder: Secondary | ICD-10-CM | POA: Diagnosis not present

## 2021-04-18 ENCOUNTER — Emergency Department (HOSPITAL_COMMUNITY)
Admission: EM | Admit: 2021-04-18 | Discharge: 2021-04-18 | Disposition: A | Discharge status: Home / Self Care | Payer: BC Managed Care – PPO | Attending: Emergency Medicine | Admitting: Emergency Medicine

## 2021-04-18 ENCOUNTER — Other Ambulatory Visit: Payer: Self-pay

## 2021-04-18 ENCOUNTER — Emergency Department (HOSPITAL_COMMUNITY): Payer: BC Managed Care – PPO

## 2021-04-18 ENCOUNTER — Encounter (HOSPITAL_COMMUNITY): Payer: Self-pay

## 2021-04-18 DIAGNOSIS — I10 Essential (primary) hypertension: Secondary | ICD-10-CM | POA: Diagnosis not present

## 2021-04-18 DIAGNOSIS — R079 Chest pain, unspecified: Secondary | ICD-10-CM | POA: Diagnosis not present

## 2021-04-18 DIAGNOSIS — R002 Palpitations: Secondary | ICD-10-CM | POA: Insufficient documentation

## 2021-04-18 DIAGNOSIS — R0602 Shortness of breath: Secondary | ICD-10-CM | POA: Diagnosis not present

## 2021-04-18 DIAGNOSIS — J189 Pneumonia, unspecified organism: Secondary | ICD-10-CM | POA: Insufficient documentation

## 2021-04-18 DIAGNOSIS — R42 Dizziness and giddiness: Secondary | ICD-10-CM | POA: Diagnosis not present

## 2021-04-18 DIAGNOSIS — Z87891 Personal history of nicotine dependence: Secondary | ICD-10-CM | POA: Diagnosis not present

## 2021-04-18 LAB — CBC
HCT: 41.3 % (ref 36.0–46.0)
Hemoglobin: 14.2 g/dL (ref 12.0–15.0)
MCH: 29.5 pg (ref 26.0–34.0)
MCHC: 34.4 g/dL (ref 30.0–36.0)
MCV: 85.9 fL (ref 80.0–100.0)
Platelets: 262 10*3/uL (ref 150–400)
RBC: 4.81 MIL/uL (ref 3.87–5.11)
RDW: 13.9 % (ref 11.5–15.5)
WBC: 9.6 10*3/uL (ref 4.0–10.5)
nRBC: 0 % (ref 0.0–0.2)

## 2021-04-18 LAB — BASIC METABOLIC PANEL
Anion gap: 8 (ref 5–15)
BUN: 14 mg/dL (ref 6–20)
CO2: 23 mmol/L (ref 22–32)
Calcium: 9.5 mg/dL (ref 8.9–10.3)
Chloride: 107 mmol/L (ref 98–111)
Creatinine, Ser: 0.84 mg/dL (ref 0.44–1.00)
GFR, Estimated: >60 mL/min (ref >60)
Glucose, Bld: 95 mg/dL (ref 70–99)
Potassium: 3.9 mmol/L (ref 3.5–5.1)
Sodium: 138 mmol/L (ref 135–145)

## 2021-04-18 LAB — I-STAT BETA HCG BLOOD, ED (MC, WL, AP ONLY): I-stat hCG, quantitative: <5 m[IU]/mL (ref <5)

## 2021-04-18 LAB — D-DIMER, QUANTITATIVE: D-Dimer, Quant: 0.41 ug/mL-FEU (ref 0.00–0.50)

## 2021-04-18 LAB — TROPONIN I (HIGH SENSITIVITY)
Troponin I (High Sensitivity): <2 ng/L (ref <18)
Troponin I (High Sensitivity): <2 ng/L (ref <18)

## 2021-04-18 MED ORDER — AZITHROMYCIN 250 MG PO TABS: 250.0000 mg | ORAL_TABLET | Freq: Every day | ORAL | 0 refills | Status: DC

## 2021-04-18 NOTE — ED Notes (Signed)
Attempted to recheck pts vital signs. Pt removed pulse ox and BP cuff after RN left room.

## 2021-04-18 NOTE — ED Provider Notes (Signed)
Emergency Medicine Provider Triage Evaluation Note  LABRENDA LASKY , a 30 y.o. female  was evaluated in triage.  Pt complains of chest pain since yesterday, intermittent SOB  ("some earlier")  some heart palpitations (1 week).  It is achy, nonradiating, non-experitional or pleuritic.   No recent surgeries, hospitalization, long travel, hemoptysis, estrogen containing OCP (prescribed but never taken), cancer history.  No unilateral leg swelling.  No history of PE or VTE.   Review of Systems  Positive: CP Negative: Fever  Physical Exam  BP (!) 142/96 (BP Location: Left Arm)   Pulse 98   Temp 98.2 F (36.8 C) (Oral)   Resp 12   Ht 5\' 8"  (1.727 m)   Wt 99.8 kg   LMP 03/02/2021   SpO2 98%   BMI 33.45 kg/m  Gen:   Awake, no distress   Resp:  Normal effort  MSK:   Moves extremities without difficulty  Other:    Medical Decision Making  Medically screening exam initiated at 7:13 PM.  Appropriate orders placed.  MARNELL MCDANIEL was informed that the remainder of the evaluation will be completed by another provider, this initial triage assessment does not replace that evaluation, and the importance of remaining in the ED until their evaluation is complete.    Tedd Sias, [place] 04/18/21 1918    Tegeler, Gwenyth Allegra, MD 04/18/21 [phone]

## 2021-04-18 NOTE — ED Notes (Addendum)
Pt pacing in room, refusing to wear monitoring equipment. Pt found pacing in hallway. RN informed pt she needs to return to her room and pt stated "I just need to be moving around." Pt returned to room and RN left door open.

## 2021-04-18 NOTE — ED Triage Notes (Signed)
Patient c/o intermittent mid chest pain, SOB and dizziness since yesterday. Patient states the "chest pain is every 10 minutes."

## 2021-04-18 NOTE — ED Provider Notes (Signed)
[facility] DEPT Provider Note   CSN: [id number] Arrival date & time: 04/18/21  1729     History Chief Complaint  Patient presents with   Chest Pain   Shortness of Breath   Dizziness    Hannah Weber is a 30 y.o. female.  The history is provided by the patient and medical records.  Chest Pain Associated symptoms: dizziness and shortness of breath   Shortness of Breath Associated symptoms: chest pain   Dizziness Associated symptoms: chest pain and shortness of breath   Hannah Weber is a 30 y.o. female who presents to the Emergency Department complaining of chest pain and palpitations.  She presents to the ED complaining of several days of chest discomfort, described as ache with intermittent palpitations.  Has associated SOB, worse when lying down.  Has associated fatigue.  No fever.  No leg edema.  Has mild nasal congestion.  No sore throat.    Does not take hormones.  No personal or family hx/o DVT/PE.      Past Medical History:  Diagnosis Date   Allergy    Hypertension     Patient Active Problem List   Diagnosis Date Noted   Hyperprolactinemia ([place]) 06/18/2020   Ketonuria 01/12/2018   Achilles tendinitis of both lower extremities 11/09/2017   Smoker unmotivated to quit 09/07/2015    History reviewed. No pertinent surgical history.   OB History   No obstetric history on file.     Family History  Problem Relation Age of Onset   Diabetes Mother     Social History   Tobacco Use   Smoking status: Former    Packs/day: 0.10    Years: 15.00    Pack years: 1.50    Types: Cigarettes    Start date: 2006    Quit date: 10/02/2020    Years since quitting: 0.5   Smokeless tobacco: Never  Vaping Use   Vaping Use: Never used  Substance Use Topics   Alcohol use: Yes    Comment: occ   Drug use: Yes    Types: Marijuana    Home Medications Prior to Admission medications   Medication Sig Start Date End Date Taking?  Authorizing Provider  azithromycin (ZITHROMAX) 250 MG tablet Take 1 tablet (250 mg total) by mouth daily. Take first 2 tablets together, then 1 every day until finished. 04/18/21  Yes Quintella Reichert, MD  acetaminophen (TYLENOL) 500 MG tablet Take 500 mg by mouth every 6 (six) hours as needed for moderate pain.    [provider]  fluticasone (FLONASE) 50 MCG/ACT nasal spray Place 2 sprays into both nostrils daily. 12/21/20   Melynda Ripple, MD  Norethindrone Acetate-Ethinyl Estrad-FE (LOESTRIN 24 FE) 1-20 MG-MCG(24) tablet Take 1 tablet by mouth daily. Patient not taking: No sig reported 06/17/20   Nicolette Bang, MD  Olopatadine HCl (PATADAY OP) Place 1 drop into both eyes daily as needed (itching).    [provider]  predniSONE (STERAPRED UNI-PAK 21 TAB) 10 MG (21) TBPK tablet Dispense one 6 day pack. Take as directed with food. 02/02/21   Nicolette Bang, MD    Allergies    Apple and Other  Review of Systems   Review of Systems  Respiratory:  Positive for shortness of breath.   Cardiovascular:  Positive for chest pain.  Neurological:  Positive for dizziness.  All other systems reviewed and are negative.  Physical Exam Updated Vital Signs BP (!) 142/96 (BP Location: Left Arm)  Pulse 98   Temp 98.2 F (36.8 C) (Oral)   Resp 12   Ht 5\' 8"  (1.727 m)   Wt 99.8 kg   LMP 03/02/2021   SpO2 98%   BMI 33.45 kg/m   Physical Exam Vitals and nursing note reviewed.  Constitutional:      Appearance: She is well-developed.  HENT:     Head: Normocephalic and atraumatic.  Cardiovascular:     Rate and Rhythm: Normal rate and regular rhythm.     Heart sounds: No murmur heard. Pulmonary:     Effort: Pulmonary effort is normal. No respiratory distress.     Breath sounds: Normal breath sounds.  Abdominal:     Palpations: Abdomen is soft.     Tenderness: There is no abdominal tenderness. There is no guarding or rebound.  Musculoskeletal:         General: No swelling or tenderness.  Skin:    General: Skin is warm and dry.  Neurological:     Mental Status: She is alert and oriented to person, place, and time.  Psychiatric:        Behavior: Behavior normal.    ED Results / Procedures / Treatments   Labs (all labs ordered are listed, but only abnormal results are displayed) Labs Reviewed  BASIC METABOLIC PANEL  CBC  D-DIMER, QUANTITATIVE  I-STAT BETA HCG BLOOD, ED ([facility], [facility], [facility] ONLY)  TROPONIN I (HIGH SENSITIVITY)  TROPONIN I (HIGH SENSITIVITY)    EKG None ED ECG REPORT   Date: 04/18/2021  Rate: 92  Rhythm: normal sinus rhythm  QRS Axis: normal  Intervals: normal  ST/T Wave abnormalities: normal  Conduction Disutrbances:none  Narrative Interpretation:   Old EKG Reviewed: none available  I have personally reviewed the EKG tracing and agree with the computerized printout as noted.  Radiology DG Chest 2 View  Result Date: 04/18/2021 CLINICAL DATA:  Chest pain. Intermittent. Shortness of breath and dizziness. EXAM: CHEST - 2 VIEW COMPARISON:  None. FINDINGS: The heart size and mediastinal contours are within normal limits. Vague hazy airspace opacity along the peripheral left mid lung zone. No pulmonary edema. No pleural effusion. No pneumothorax. No acute osseous abnormality. IMPRESSION: Vague hazy airspace opacity along the peripheral left mid lung zone. Finding may represent artifact due to overlying soft tissues versus infection/inflammation. Electronically Signed   By: Iven Finn M.D.   On: 04/18/2021 18:44    Procedures Procedures    Medications Ordered in ED Medications - No data to display  ED Course  I have reviewed the triage vital signs and the nursing notes.  Pertinent labs & imaging results that were available during my care of the patient were reviewed by me and considered in my medical decision making (see chart for details).    MDM Rules/Calculators/A&P                         Patient here  for evaluation of chest pressure, palpitations for the last few days. Chest x-ray is concerning for possible developing pneumonia. Presentation is not consistent with ACS, doubt PE - D dimer is negative. Will start on antibiotics given her symptoms. Offered COVID testing and patient declines. Discussed outpatient follow-up and return precautions.  Final Clinical Impression(s) / ED Diagnoses Final diagnoses:  Community acquired pneumonia of left lung, unspecified part of lung  Palpitations    Rx / DC Orders ED Discharge Orders          Ordered  azithromycin (ZITHROMAX) 250 MG tablet  Daily        04/18/21 2322             Quintella Reichert, MD 04/18/21 2328

## 2021-04-20 ENCOUNTER — Telehealth: Payer: Self-pay | Admitting: Nurse Practitioner

## 2021-05-23 ENCOUNTER — Other Ambulatory Visit: Payer: Self-pay

## 2021-05-23 ENCOUNTER — Encounter: Payer: Self-pay | Admitting: Family Medicine

## 2021-05-23 ENCOUNTER — Ambulatory Visit (INDEPENDENT_AMBULATORY_CARE_PROVIDER_SITE_OTHER): Payer: BC Managed Care – PPO | Admitting: Family Medicine

## 2021-05-23 VITALS — BP 124/80 | HR 93 | Temp 97.3°F | Resp 20 | Ht 68.0 in | Wt 227.0 lb

## 2021-05-23 DIAGNOSIS — Z7689 Persons encountering health services in other specified circumstances: Secondary | ICD-10-CM

## 2021-05-23 DIAGNOSIS — F32A Depression, unspecified: Secondary | ICD-10-CM

## 2021-05-23 DIAGNOSIS — F419 Anxiety disorder, unspecified: Secondary | ICD-10-CM | POA: Diagnosis not present

## 2021-05-23 MED ORDER — PAROXETINE HCL 20 MG PO TABS: 20.0000 mg | ORAL_TABLET | Freq: Every day | ORAL | 0 refills | Status: DC

## 2021-05-23 NOTE — Progress Notes (Signed)
New Patient Office Visit  Subjective:  Patient ID: Hannah Weber, female    DOB: 12-Feb-1991  Age: 30 y.o. MRN: [id number]  CC:  Chief Complaint  Patient presents with   paperwork    HPI [name] for to establish care. Patient reports that she would like paperwork completed that would give her 6 days a month for mental health issues. She currently does not see a counselor nor is she on any meds. She reports that she is not seeing her preferred counselor or is on no meds with the primary reason being financial. 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                                                                                                                        Past Medical History:  Diagnosis Date   Allergy    Hypertension     History reviewed. No pertinent surgical history.  Family History  Problem Relation Age of Onset   Diabetes Mother     Social History   Socioeconomic History   Marital status: Significant Other    Spouse name: Mertie Clause   Number of children: Not on file   Years of education: Not on file   Highest education level: Not on file  Occupational History   Occupation: customer service    Employer: [employer]  Tobacco Use   Smoking status: Former    Packs/day: 0.10    Years: 15.00    Pack years: 1.50    Types: Cigarettes    Start date: 2006    Quit date: 10/02/2020    Years since quitting: 0.6   Smokeless tobacco: Never  Vaping Use   Vaping Use: Never used  Substance and Sexual Activity   Alcohol use: Yes    Comment: occ   Drug use: Yes    Types: Marijuana   Sexual activity: Never    Birth control/protection: None  Other Topics Concern   Not on file  Social History Narrative   Not on file   Social Determinants of Health   Financial Resource Strain: Not on file  Food Insecurity: Not on file  Transportation Needs: Not on file  Physical Activity: Not on file  Stress: Not on file  Social Connections:  Not on file  Intimate Partner Violence: Not on file    ROS Review of Systems  Psychiatric/Behavioral:  Negative for behavioral problems, self-injury and suicidal ideas. The patient is nervous/anxious.   All other systems reviewed and are negative.  Objective:   Today's Vitals: BP 124/80 (BP Location: Right Arm, Patient Position: Sitting, Cuff Size: Large)   Pulse 93   Temp (!) 97.3 F (36.3 C)   Resp 20   Ht '5\' 8"'$  (1.727 m)   Wt 227 lb (103 kg)   LMP 05/09/2021 (Approximate)   SpO2 98%   BMI 34.52 kg/m   Physical Exam Vitals and nursing note reviewed.  Constitutional:  General: She is not in acute distress.    Appearance: She is obese.  Cardiovascular:     Rate and Rhythm: Normal rate and regular rhythm.  Pulmonary:     Effort: Pulmonary effort is normal.     Breath sounds: Normal breath sounds.  Neurological:     General: No focal deficit present.     Mental Status: She is alert and oriented to person, place, and time.  Psychiatric:        Mood and Affect: Mood normal. Affect is blunt.        Speech: Speech normal.        Behavior: Behavior normal.    Assessment & Plan:   1. Anxiety and depression Patient prescribed paxil 20 mg daily and referred to [facility] for counseling. Discussed at length with patient that she needs to try to utilize medical options such as meds and counseling. It may be prudent for her consider employment in a different arena as 6 days/month is excessive to allow without any accountability and perhaps it is the type of job that she is performing that may be increasing her symptoms. Patient vu.   2. Encounter to establish care     Outpatient Encounter Medications as of 05/23/2021  Medication Sig   acetaminophen (TYLENOL) 500 MG tablet Take 500 mg by mouth every 6 (six) hours as needed for moderate pain.   azithromycin (ZITHROMAX) 250 MG tablet Take 1 tablet (250 mg total) by mouth daily. Take first 2 tablets together, then 1 every day  until finished.   fluticasone (FLONASE) 50 MCG/ACT nasal spray Place 2 sprays into both nostrils daily.   Olopatadine HCl (PATADAY OP) Place 1 drop into both eyes daily as needed (itching).   PARoxetine (PAXIL) 20 MG tablet Take 1 tablet (20 mg total) by mouth daily.   [DISCONTINUED] Norethindrone Acetate-Ethinyl Estrad-FE (LOESTRIN 24 FE) 1-20 MG-MCG(24) tablet Take 1 tablet by mouth daily. (Patient not taking: No sig reported)   [DISCONTINUED] predniSONE (STERAPRED UNI-PAK 21 TAB) 10 MG (21) TBPK tablet Dispense one 6 day pack. Take as directed with food. (Patient not taking: Reported on 05/23/2021)   No facility-administered encounter medications on file as of 05/23/2021.    Follow-up: Return in about 4 weeks (around 06/20/2021).   Becky Sax, MD

## 2021-05-23 NOTE — Progress Notes (Signed)
paperwork

## 2021-06-01 ENCOUNTER — Institutional Professional Consult (permissible substitution): Payer: BC Managed Care – PPO | Admitting: Clinical

## 2021-06-01 ENCOUNTER — Encounter: Payer: Self-pay | Admitting: Family Medicine

## 2021-06-02 ENCOUNTER — Ambulatory Visit: Payer: BC Managed Care – PPO | Admitting: Family

## 2021-06-14 ENCOUNTER — Ambulatory Visit (INDEPENDENT_AMBULATORY_CARE_PROVIDER_SITE_OTHER): Payer: BC Managed Care – PPO | Admitting: Clinical

## 2021-06-14 ENCOUNTER — Other Ambulatory Visit: Payer: Self-pay

## 2021-06-14 DIAGNOSIS — F411 Generalized anxiety disorder: Secondary | ICD-10-CM

## 2021-06-14 DIAGNOSIS — R45851 Suicidal ideations: Secondary | ICD-10-CM

## 2021-06-14 DIAGNOSIS — F331 Major depressive disorder, recurrent, moderate: Secondary | ICD-10-CM

## 2021-06-15 NOTE — BH Specialist Note (Signed)
Integrated Behavioral Health Initial In-Person Visit  MRN: [id number] Name: Hannah Weber  Number of [facility] Clinician visits:: 1/6 Session Start time: 1:40pm  Session End time: 2:30pm Total time: 50  minutes  Types of Service: Individual psychotherapy  Interpretor:No. Interpretor Name and Language: N/A   Warm Hand Off Completed.        Subjective: Hannah Weber is a 30 y.o. female accompanied by  self Patient was referred by PCP Adline Potter, MD for anxiety and depression. Patient reports the following symptoms/concerns: Pt reports feeling depressed, decreased interest in daily activities, difficulty sleeping, decreased energy, self-esteem disturbances, difficulty concentrating, anxiousness, excessive worrying, difficulty relaxing, irritability, and racing thoughts. Reports that she noticed a change in her mood after graduating high school but feels like her depressive thoughts have worsened in the past year. Reports that she feels overwhelmed frequently with her job and is unhappy. Reports that she consumes alcohol daily with at least one beer/day and drinks more on the weekend when off work. Reports daily marijuana use. Reports that she frequently compares herself to others on social media and often feels bad about herself. 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   Duration of problem: 12 years; Severity of problem: moderate  Objective: Mood: Anxious and Depressed and Affect: Appropriate and Tearful Risk of harm to self or others: Suicidal ideation (No specific plan/intent, reports that she has guns in her household but does not plan to use them. Stated "I mean I have guns I could use if I wanted to but I don't and that I never created a plan". Reports a hx of a suicide attempt about two months ago by putting a gun to her head however she stopped herself after thinking about her family and her future.)  Life  Context: Family and Social: Reports having a support system as her parents but has difficulty disclosing her emotions to them. Reports friends as support system.  School/Work: Pt is employed full-time.  Self-Care: Reports marijuana use and alcohol use as coping skill. Pt is currently on medication managed by PCP however pt has difficulty adhering to medication due to it making her sleepy.  Life Changes: Reports that she has been feeling overwhelmed with work. Pt has been frequently comparing herself due to feeling like she is not where she is supposed to be in life.   Patient and/or Family's Strengths/Protective Factors: Social and Emotional competence and Sense of purpose  Goals Addressed: Patient will: Reduce symptoms of: anxiety and depression Increase knowledge and/or ability of: coping skills and healthy habits  Demonstrate ability to: Increase healthy adjustment to current life circumstances, Decrease self-medicating behaviors, and Decrease self-comparison behaviors  Progress towards Goals: Ongoing  Interventions: Interventions utilized: Mindfulness or Psychologist, educational, CBT Cognitive Behavioral Therapy, Supportive Counseling, and Psychoeducation and/or Health Education  Standardized Assessments completed:  MDQ (Mood Disorder Questionnaire), GAD-7, and PHQ 9  Patient and/or Family Response: Pt receptive to tx. Pt receptive to psychoeducation provided on anxiety, depression, alcohol use, and marijuana use. Pt receptive to cognitive restructuring utilized to improve cognitive processing skills. Pt will begin utilizing deep breathing exercises, positive affirmations, logging off of social media when comparing oneself begins, and provided crisis resources.   Patient Centered Plan: Patient is on the following Treatment Plan(s):  Depression and Anxiety  Assessment: Endorses passive SI with method, without a specific plan/intent. Endorses protective factors as parents and motivation to  improve. Denies auditory/visual  hallucinations. Safety plan completed and pt provided crisis resources.  Pt agreed to lock her weapons in a case. Patient currently experiencing anxiety and depression. 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 Reports that she frequently compares herself to others on social media and often feels bad about her accomplishments in life. Pt appears to have difficulty with self-esteem.  Pt has been experiencing depression for several years. Pt appears to utilize marijuana and alcohol as coping skills.    Patient may benefit from ongoing therapy and involvement in substance use tx. Pt has a hx of attending AA and plans to restart going to AA. LCSWA provided psychoeducation on depression, anxiety, alcohol use, and marijuana use. LCSWA attempted to normalize pt's substance use as a coping skills. LCSWA utilized cognitive restructuring to improve pt's cognitive processing skills. LCSWA encouraged pt to utilize deep breathing exercises and positive affirmations. LCSWA encouraged pt to log off social media when beginning to compare herself to others. Pt is requesting to have 3 days off of work/month for therapy and AA meeting. LCSWA will inform PCP. LCSWA will fu with pt.  Plan: Follow up with behavioral health clinician on : 07/05/21 Behavioral recommendations: Utilize deep breathing exercises, log off social media when beginning to compare yourself to others, decrease alcohol and marijuana use, and utilize provided crisis resources if SI arises with plan, means, and intent. Lock away current weapon (gun). Referral(s): [facility] (In Clinic) "From scale of 1-10, how likely are you to follow plan?": 10  Geeta Dworkin C Cantrell Larouche, LCSW

## 2021-06-20 ENCOUNTER — Other Ambulatory Visit: Payer: Self-pay

## 2021-06-20 ENCOUNTER — Encounter: Payer: Self-pay | Admitting: Family Medicine

## 2021-06-20 ENCOUNTER — Ambulatory Visit (INDEPENDENT_AMBULATORY_CARE_PROVIDER_SITE_OTHER): Payer: BC Managed Care – PPO | Admitting: Family Medicine

## 2021-06-20 VITALS — BP 136/81 | HR 109 | Temp 98.1°F | Resp 16 | Ht 68.0 in | Wt 223.0 lb

## 2021-06-20 DIAGNOSIS — Z0289 Encounter for other administrative examinations: Secondary | ICD-10-CM | POA: Diagnosis not present

## 2021-06-20 DIAGNOSIS — F411 Generalized anxiety disorder: Secondary | ICD-10-CM

## 2021-06-20 NOTE — Progress Notes (Signed)
Established Patient Office Visit  Subjective:  Patient ID: Hannah Weber, female    DOB: 04-27-1991  Age: 30 y.o. MRN: [id number]  CC:  Chief Complaint  Patient presents with   Follow-up    Physiatrist      HPI Hannah Weber presents for follow-up of anxiety.  Patient reports that she has been drowsy since taking the medicine.  She has been taking the meds when she begins her activities in the morning.  She also has seen the counselor.  She has agreed to 2 days off for extreme anxiety.  She has forms to complete for work based on that information.  Past Medical History:  Diagnosis Date   Allergy    Hypertension       Social History   Socioeconomic History   Marital status: Significant Other    Spouse name: Mertie Clause   Number of children: Not on file   Years of education: Not on file   Highest education level: Not on file  Occupational History   Occupation: customer service    Employer: [employer]  Tobacco Use   Smoking status: Former    Packs/day: 0.10    Years: 15.00    Pack years: 1.50    Types: Cigarettes    Start date: 2006    Quit date: 10/02/2020    Years since quitting: 0.7   Smokeless tobacco: Never  Vaping Use   Vaping Use: Never used  Substance and Sexual Activity   Alcohol use: Yes    Comment: occ   Drug use: Yes    Types: Marijuana   Sexual activity: Never    Birth control/protection: None  Other Topics Concern   Not on file  Social History Narrative   Not on file   Social Determinants of Health   Financial Resource Strain: Not on file  Food Insecurity: Not on file  Transportation Needs: Not on file  Physical Activity: Not on file  Stress: Not on file  Social Connections: Not on file  Intimate Partner Violence: Not on file    ROS Review of Systems  Psychiatric/Behavioral:  Positive for sleep disturbance. Negative for self-injury and suicidal ideas. The patient is nervous/anxious.   All other systems reviewed and are  negative.  Objective:   Today's Vitals: BP 136/81 (BP Location: Right Arm, Patient Position: Sitting, Cuff Size: Large)   Pulse (!) 109   Temp 98.1 F (36.7 C) (Oral)   Resp 16   Ht 5\' 8"  (1.727 m)   Wt 223 lb (101.2 kg)   SpO2 98%   BMI 33.91 kg/m   Physical Exam Vitals and nursing note reviewed.  Constitutional:      General: She is not in acute distress.    Appearance: She is obese.  Cardiovascular:     Rate and Rhythm: Normal rate and regular rhythm.  Pulmonary:     Effort: Pulmonary effort is normal.     Breath sounds: Normal breath sounds.  Neurological:     General: No focal deficit present.     Mental Status: She is alert and oriented to person, place, and time.  Psychiatric:        Mood and Affect: Mood normal. Affect is blunt.        Speech: Speech normal.        Behavior: Behavior normal.    Assessment & Plan:  1. Generalized anxiety disorder Patient to continue present management but to switch dosing to prior to bedtime to see if  this alleviates side effect of drowsiness.  Will monitor.  2. Encounter for completion of form with patient Form completed.  Outpatient Encounter Medications as of 06/20/2021  Medication Sig   acetaminophen (TYLENOL) 500 MG tablet Take 500 mg by mouth every 6 (six) hours as needed for moderate pain.   azithromycin (ZITHROMAX) 250 MG tablet Take 1 tablet (250 mg total) by mouth daily. Take first 2 tablets together, then 1 every day until finished.   fluticasone (FLONASE) 50 MCG/ACT nasal spray Place 2 sprays into both nostrils daily.   Olopatadine HCl (PATADAY OP) Place 1 drop into both eyes daily as needed (itching).   PARoxetine (PAXIL) 20 MG tablet Take 1 tablet (20 mg total) by mouth daily.   No facility-administered encounter medications on file as of 06/20/2021.    Follow-up: Return in about 3 weeks (around 07/11/2021) for follow up.   Becky Sax, MD

## 2021-06-20 NOTE — Progress Notes (Signed)
Patient is here for follow- up Physiatrist

## 2021-06-21 ENCOUNTER — Encounter: Payer: Self-pay | Admitting: Family Medicine

## 2021-06-29 NOTE — Telephone Encounter (Signed)
complete

## 2021-07-05 ENCOUNTER — Ambulatory Visit (INDEPENDENT_AMBULATORY_CARE_PROVIDER_SITE_OTHER): Payer: BC Managed Care – PPO | Admitting: Clinical

## 2021-07-05 ENCOUNTER — Other Ambulatory Visit: Payer: Self-pay

## 2021-07-05 DIAGNOSIS — F331 Major depressive disorder, recurrent, moderate: Secondary | ICD-10-CM

## 2021-07-05 DIAGNOSIS — R45851 Suicidal ideations: Secondary | ICD-10-CM | POA: Diagnosis not present

## 2021-07-05 DIAGNOSIS — F411 Generalized anxiety disorder: Secondary | ICD-10-CM

## 2021-07-12 NOTE — BH Specialist Note (Signed)
Integrated Behavioral Health Follow Up In-Person Visit  MRN: [id number] Name: Hannah Weber  Number of [place] Clinician visits: 2/6 Session Start time: 11:00am  Session End time: 12:00pm Total time: 60 minutes  Types of Service: Individual psychotherapy  Interpretor:No. Interpretor Name and Language: N/A  Subjective: Hannah Weber is a 30 y.o. female accompanied by  self Patient was referred by PCP Adline Potter, MD for anxiety and depression. Patient reports the following symptoms/concerns: Pt reports feeling depressed, self-esteem disturbances, trouble concentrating, anxiousness, excessive worrying, irritability, and trouble relaxing. Reports that she continues to feel overwhleed with her job. Reports that her job has started giving her an additional 2 days off/month. 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 Reports that she is decreasing alcohol use but did not identify the amount. Reports that she continues to use marijuana.  Duration of problem: 12 years; Severity of problem: moderate  Objective: Mood: Anxious and Depressed and Affect: Appropriate and Tearful Risk of harm to self or others: Suicidal ideation   Life Context: Family and Social: Reports having a support system as her parents but has difficulty disclosing her emotions to them. Reports friends as support system.  School/Work: Pt is employed full-time.  Self-Care: Reports marijuana use and alcohol use as coping skill. Pt is currently on medication managed by PCP however pt has difficulty adhering to medication due to it making her sleepy.  Life Changes: Reports that she has been feeling overwhelmed with work. Pt has been frequently comparing herself due to feeling like she is not where she is supposed to be in life. (No changes to life context)  Patient and/or Family's Strengths/Protective Factors: Social and Emotional competence and Sense of  purpose  Goals Addressed: Patient will:  Reduce symptoms of: anxiety and depression   Increase knowledge and/or ability of: coping skills and healthy habits   Demonstrate ability to: Increase healthy adjustment to current life circumstances, Decrease self-medicating behaviors, and Decrease self-comparison behaviors  Progress towards Goals: Ongoing  Interventions: Interventions utilized:  Mindfulness or Psychologist, educational, CBT Cognitive Behavioral Therapy, Supportive Counseling, and Psychoeducation and/or Health Education Standardized Assessments completed: GAD-7 and PHQ 9  Patient and/or Family Response: Pt receptive to tx. Pt receptive to psychoeducation provided on anxiety and depression. Pt receptive to cognitive restructuring and assistance with goal setting. Pt will begin setting an alarm in order to wake up earlier as opposed to 2pm daily. Pt will continue utilizing deep breathing exercises, positive affirmations, and continue limiting social media exposure.   Patient Centered Plan: Patient is on the following Treatment Plan(s): Depression and anxiety  Assessment: Endorses passive SI with method, without a specific plan/intent. 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 Denies auditory/visual hallucinations. Pt provided with crisis resources and acknowledged proper precaution to take if SI arises with plan, means, and intent. Pt reports she has not locked her weapon in a case and states that she plans to lock them. Patient currently experiencing anxiety and depression. Pt appears to have difficulty with self-esteem which has led to difficulty with motivation for starting her business. Pt experiences a decrease in energy which contributes to decreased motivation. Pt has recently been granted 2 additional days off/month. Pt appears motivated to improve her mental health as she states that she wants to improve it.   Patient may benefit from ongoing therapy  and involvement in substance use tx. Pt reports attending one [place] meeting since  previous session and plans to attend more. LCSWA provided psychoeducation on anxiety and depression. LCSWA utilized cognitive restructuring and assisted pt with goal setting. LCSWA encouraged pt to set an alarm in the morning to assist in waking up earlier and to incorporate a self-care routine after waking up in an effort to improve pt's energy and motivation. LCSWA will fu with pt.  Plan: Follow up with behavioral health clinician on : 07/25/21 Behavioral recommendations: set an alarm to wake up in the morning around 10am and incorporate self-care. Adhere to medications. Utilize provided crisis resources if SI arises with plan, means, and intent.  Referral(s): [place] (In Clinic) "From scale of 1-10, how likely are you to follow plan?": 10  Courtany Mcmurphy C Merit Maybee, LCSW

## 2021-07-25 ENCOUNTER — Ambulatory Visit: Payer: BC Managed Care – PPO | Admitting: Clinical

## 2021-07-31 IMAGING — CT CT HEAD W/O CM
2 of 4 series · 13 of 47 positions shown, 16 images · non-contrast
Comparison: Head CT 06/02/2020

CLINICAL DATA: Head trauma, minor, normal mental status. Additional
history provided: Patient reports ringing in bilateral ears for the
past 3 months.

EXAM:
CT HEAD WITHOUT CONTRAST
TECHNIQUE: Contiguous axial images were obtained from the base of the skull
through the vertex without intravenous contrast.

[Series 4: coronal soft tissue · coronal · 0.31mm/px · 3 of 71 slices shown]
[im 24/71  brain]
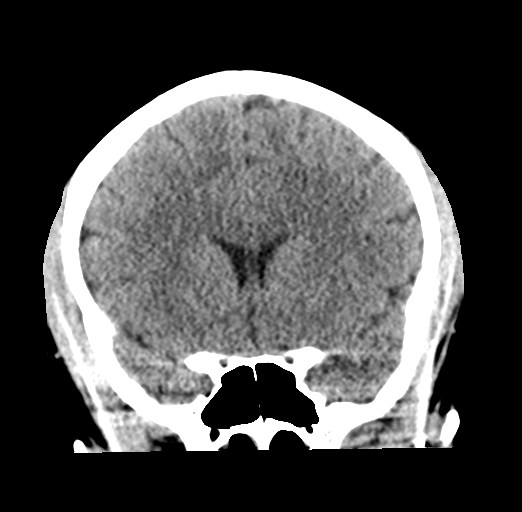
[im 32/71  brain]
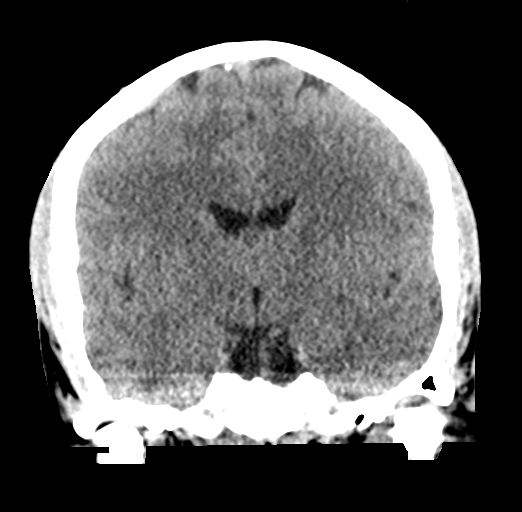
[im 39/71  brain]
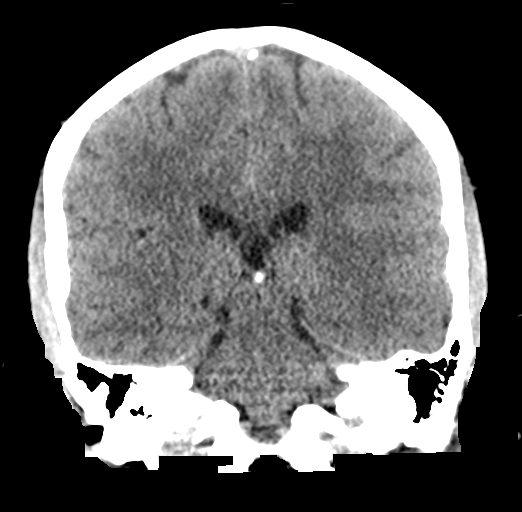

[Series 7: axial soft tissue · axial · 0.32mm/px · z∈[+1612,+1756]mm · 10 of 54 slices shown, 13 images]
[im 3/54  brain]
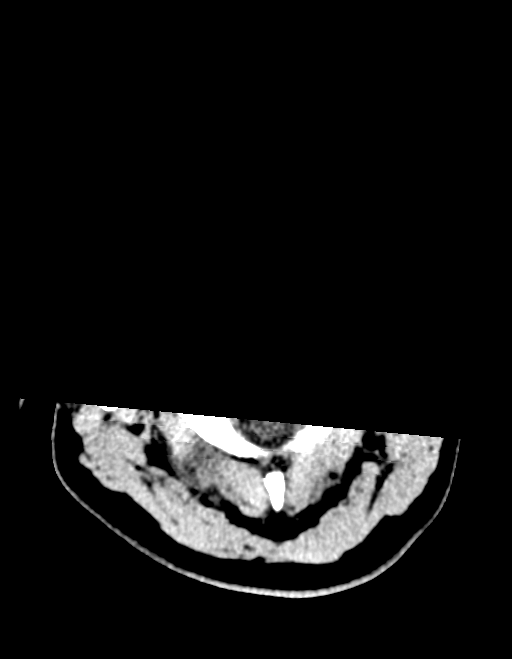
[im 3/54  bone]
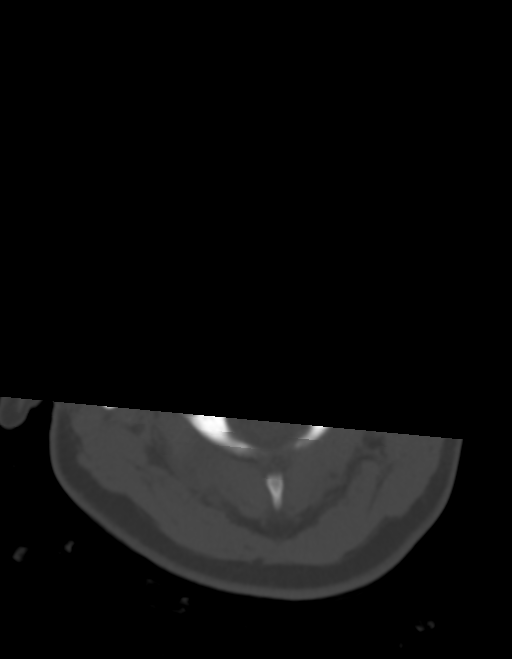
[im 9/54  brain]
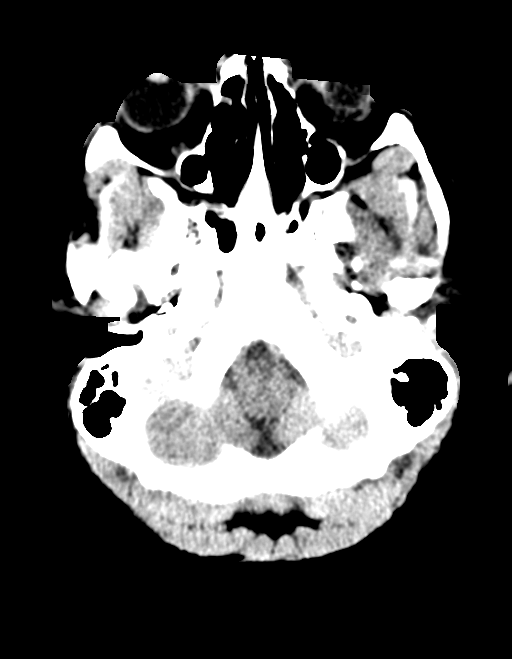
[im 15/54  brain]
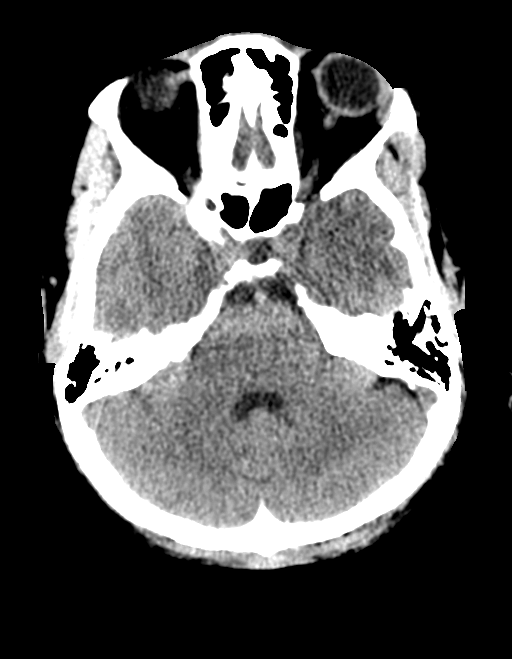
[im 18/54  brain]
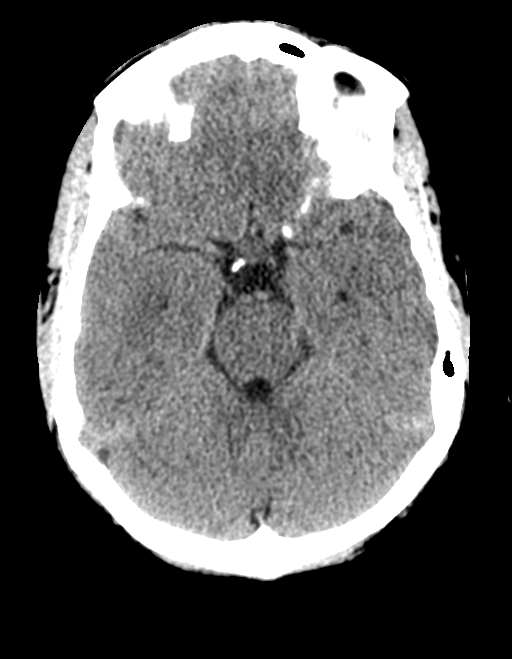
[im 24/54  brain]
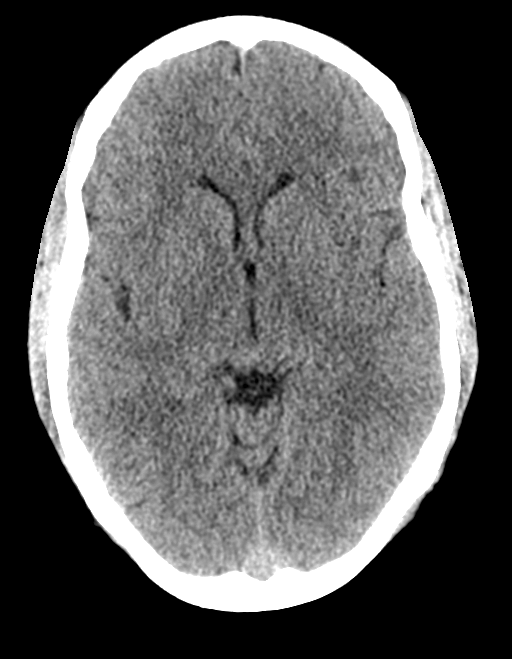
[im 24/54  bone]
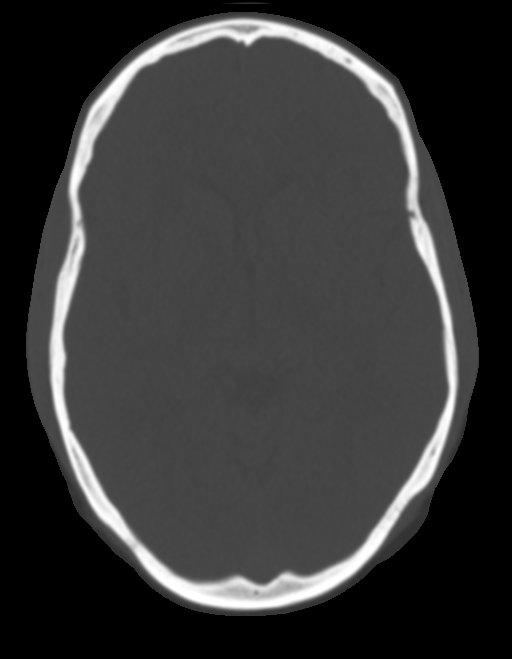
[im 30/54  brain]
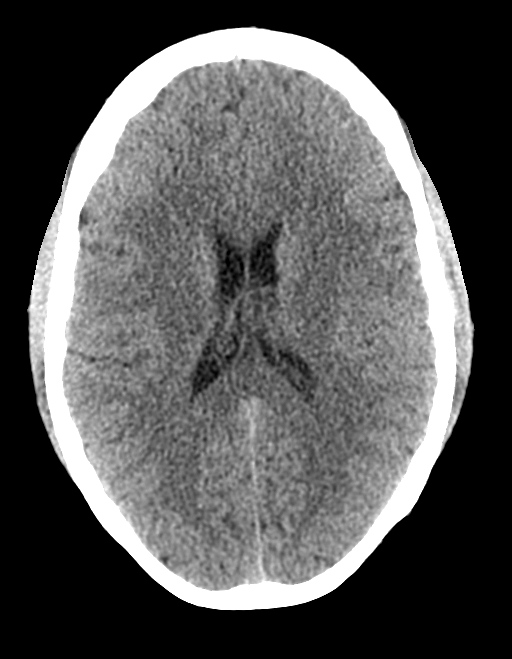
[im 36/54  brain]
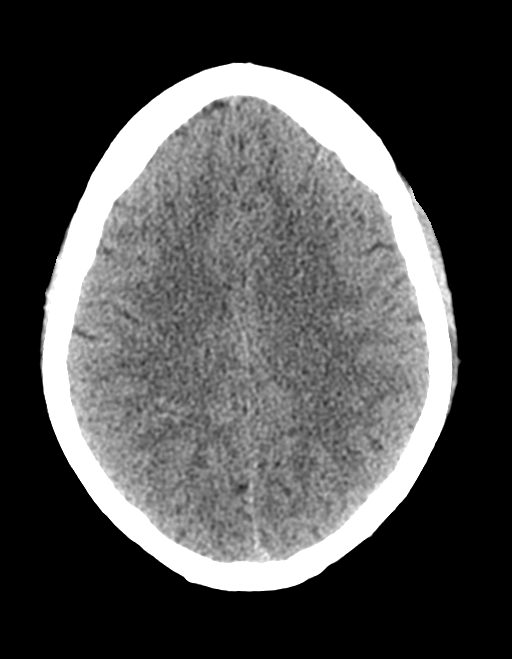
[im 39/54  brain]
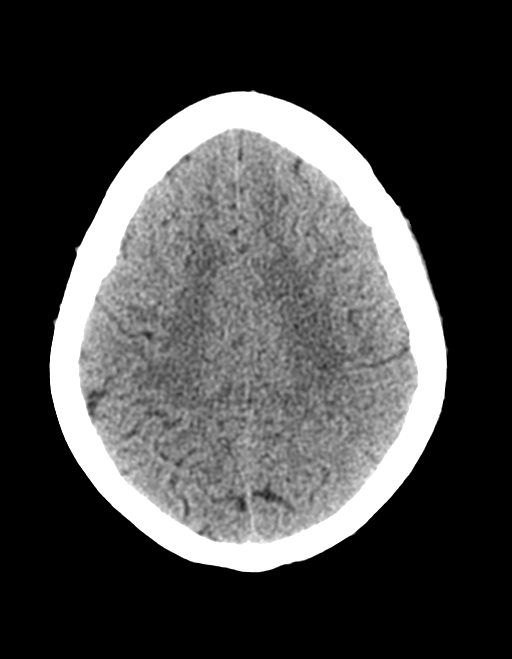
[im 45/54  brain]
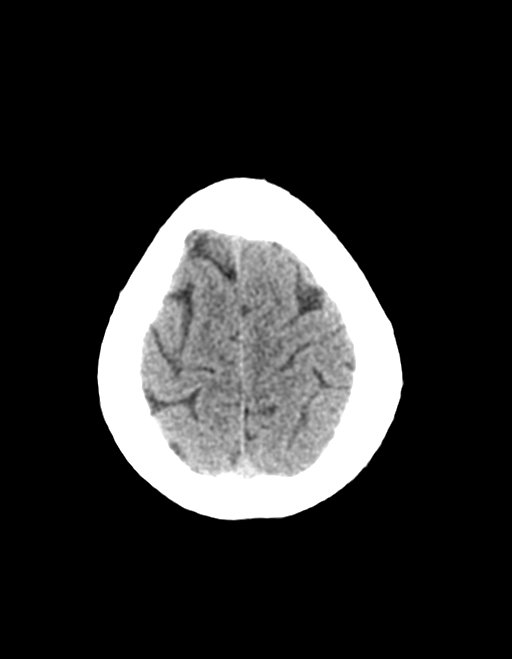
[im 45/54  bone]
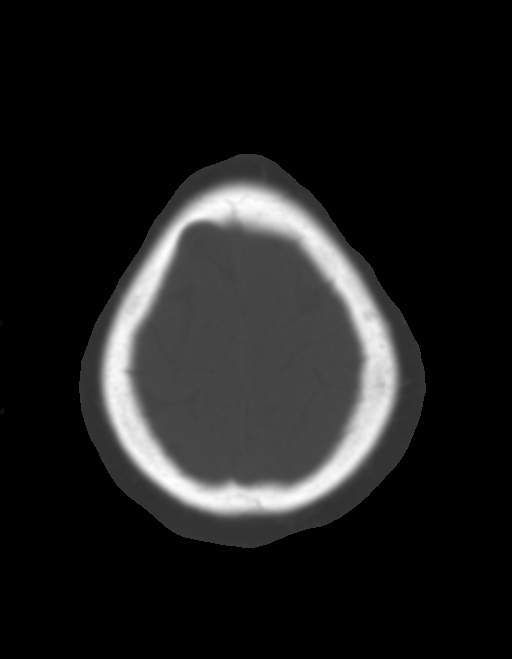
[im 51/54  brain]
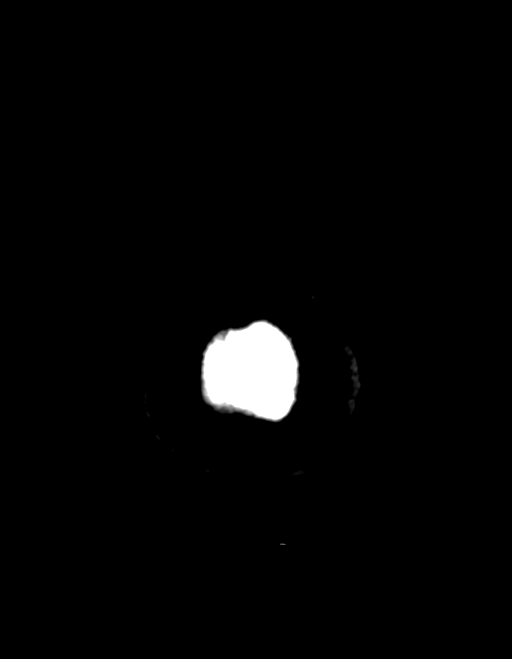

[13 of 47 positions shown; findings below may reference images not displayed]

FINDINGS: Brain:

Cerebral volume is normal.

There is no acute intracranial hemorrhage.

No demarcated cortical infarct.

No extra-axial fluid collection.

No evidence of intracranial mass.

No midline shift.

Vascular: No hyperdense vessel.

Skull: Normal. Negative for fracture or focal lesion.

Sinuses/Orbits: Visualized orbits show no acute finding. Trace
scattered bilateral ethmoid sinus mucosal thickening at the imaged
levels.

Other: No significant mastoid effusion.
IMPRESSION: No evidence of acute intracranial abnormality.

Minimal bilateral ethmoid sinus mucosal thickening.

## 2021-08-17 ENCOUNTER — Telehealth: Payer: Self-pay | Admitting: Family Medicine

## 2021-08-17 NOTE — Telephone Encounter (Signed)
Called pt to sched follow up. No answer. LVM to cb to sched.

## 2021-08-31 ENCOUNTER — Telehealth: Payer: Self-pay | Admitting: Family Medicine

## 2021-08-31 ENCOUNTER — Telehealth (INDEPENDENT_AMBULATORY_CARE_PROVIDER_SITE_OTHER): Payer: Self-pay

## 2021-08-31 NOTE — Telephone Encounter (Signed)
Pt called to cancel follow up appt for 09/01/21 @9 :30 states cannot make it. Pt asked for other days not available (Friday 1PM). I offered next available at [facility] but pt stated that wouldn't work. Pt would like to speak w/ [place] to resched this appt.

## 2021-08-31 NOTE — Telephone Encounter (Signed)
Copied from [place] [id number]. Topic: General - Other >> Aug 31, 2021 10:31 AM Bayard Beaver wrote: Reason for CRM: Patient called I for appt with [facility]

## 2021-09-01 ENCOUNTER — Ambulatory Visit (INDEPENDENT_AMBULATORY_CARE_PROVIDER_SITE_OTHER): Payer: BC Managed Care – PPO | Admitting: Clinical

## 2021-09-06 NOTE — Telephone Encounter (Signed)
Spoke with pt and scheduled appt for 09/20/21 at 11:00

## 2021-09-12 DIAGNOSIS — Z20822 Contact with and (suspected) exposure to covid-19: Secondary | ICD-10-CM | POA: Diagnosis not present

## 2021-09-12 DIAGNOSIS — U071 COVID-19: Secondary | ICD-10-CM | POA: Diagnosis not present

## 2021-09-20 ENCOUNTER — Ambulatory Visit: Payer: BC Managed Care – PPO | Admitting: Clinical

## 2021-10-04 ENCOUNTER — Telehealth: Payer: Self-pay | Admitting: Family Medicine

## 2021-10-04 ENCOUNTER — Ambulatory Visit: Payer: BC Managed Care – PPO | Admitting: Clinical

## 2021-10-04 NOTE — Telephone Encounter (Signed)
Pt came in asking to cancel her appt today and she needs to reschedule as soon as possible

## 2021-10-28 NOTE — Telephone Encounter (Signed)
I spoke with pt and scheduled appt for 11/10/21.

## 2021-11-10 ENCOUNTER — Ambulatory Visit (INDEPENDENT_AMBULATORY_CARE_PROVIDER_SITE_OTHER): Payer: BC Managed Care – PPO | Admitting: Clinical

## 2021-11-10 ENCOUNTER — Telehealth (INDEPENDENT_AMBULATORY_CARE_PROVIDER_SITE_OTHER): Payer: Self-pay | Admitting: Clinical

## 2021-11-10 NOTE — Telephone Encounter (Signed)
I attempted to call pt per message that she needed to reschedule. No answer, left vm.

## 2021-12-01 ENCOUNTER — Ambulatory Visit (INDEPENDENT_AMBULATORY_CARE_PROVIDER_SITE_OTHER): Payer: BC Managed Care – PPO | Admitting: Clinical

## 2021-12-01 ENCOUNTER — Other Ambulatory Visit: Payer: Self-pay

## 2021-12-01 DIAGNOSIS — F331 Major depressive disorder, recurrent, moderate: Secondary | ICD-10-CM

## 2021-12-01 DIAGNOSIS — F411 Generalized anxiety disorder: Secondary | ICD-10-CM

## 2021-12-02 NOTE — BH Specialist Note (Signed)
Integrated Behavioral Health Follow Up In-Person Visit ? ?MRN: [id number] ?Name: Hannah Weber ? ?Number of [place] Clinician visits: 3- Third Visit ? ?Session Start time: 1520 ?  ?Session End time: 1600 ? ?Total time in minutes: 40 ? ? ?Types of Service: Individual psychotherapy ? ?Interpretor:No. Interpretor Name and Language: N/a ? ?Subjective: ?Hannah Weber is a 31 y.o. female accompanied by  self ?Patient was referred by Dorna Mai, MD for anxiety and depression. ?Patient reports the following symptoms/concerns: Reports feeling depressed, anxious, excessive worrying, decrease interest in activities, and feeling overwhelmed with her job. Reports that she also feels anxious in social settings. Reports that she will often avoid going to large stores. Reports that she worries about being embarrassed and what others will think of her. Reports that she has also been in an "unhealthy" relationship. Reports that her partner frequently makes her feel bad about herself. Reports that she has decreased alcohol use to her off days (twice/week). Stated that she has been picking up "OCD" habits such as checking to see if something is off or locked multiple times.  ?Duration of problem: 12 years; Severity of problem: moderate ? ?Objective: ?Mood: Anxious and Depressed and Affect: Appropriate ?Risk of harm to self or others: Suicidal ideation Denies plan/intent ? ?Life Context: ?Family and Social: Reports having a support system as her parents but has difficulty disclosing her emotions to them. Reports friends as support system.  ?School/Work: Pt is employed full-time.  ?Self-Care: Reports marijuana use and alcohol use as coping skill, however she has decreased alcohol use.  Pt is currently on medication managed by PCP however pt has difficulty adhering to medication due to it making her sleepy.  ?Life Changes: Reports that she has been feeling overwhelmed with work. Pt has been frequently  comparing herself due to feeling like she is not where she is supposed to be in life. ? ?Patient and/or Family's Strengths/Protective Factors: ?Social and Emotional competence and Sense of purpose ? ?Goals Addressed: ?Patient will: ? Reduce symptoms of: anxiety and depression  ? Increase knowledge and/or ability of: coping skills and healthy habits  ? Demonstrate ability to: Increase healthy adjustment to current life circumstances, Decrease self-medicating behaviors, and Decrease self-comparison behaviors ? ?Progress towards Goals: ?Ongoing ? ?Interventions: ?Interventions utilized:  Optician, dispensing, CBT Cognitive Behavioral Therapy, Supportive Counseling, and Psychoeducation and/or Health Education ?Standardized Assessments completed: Not Needed ? ?Patient and/or Family Response: Pt receptive to tx. Pt receptive to psychoeducation provided on depression, OCD, and a nxiety. Pt receptive to cognitive restructuring to decrease negative thoughts. Pt receptive to developing an affirmation and deep breathing. ? ?Patient Centered Plan: ?Patient is on the following Treatment Plan(s): Depression and anxiety ? ?Assessment: ?Endorses passive SI. Denies plan/intent. 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 Patient currently experiencing depression and anxiety. Pt provided with crisis resources and safety plan. Pt agrees to utilize crisis resources if SI arises with plan, means, and intent. Pt appears to have difficulty with social anxiety and worries about what others think of her. Pt frequently feels overwhelmed with work. Pt appears to have difficulty with self-esteem.  ? ?Patient may benefit from psychiatry. LCSW provided psychoeducation on depression, OCD, and anxiety. LCSW utilized cognitive restructuring to decrease negative thoughts. LCSW encouraged pt to utilize positive affirmations and deep breathing. LCSW will refer pt.. ? ?Plan: ?Follow up with behavioral health clinician  on : 12/12/21 ?Behavioral recommendations: Utilize provided crisis resources if SI arises with plan, means,  and intent.  ?Referral(s): [place] (In Clinic) ?"From scale of 1-10, how likely are you to follow plan?": 10 ? ?Mandy Fitzwater C Janeva Peaster, LCSW ? ? ?

## 2021-12-12 ENCOUNTER — Other Ambulatory Visit: Payer: Self-pay

## 2021-12-12 ENCOUNTER — Ambulatory Visit: Payer: BC Managed Care – PPO | Attending: Family Medicine | Admitting: Clinical

## 2021-12-12 DIAGNOSIS — F331 Major depressive disorder, recurrent, moderate: Secondary | ICD-10-CM | POA: Diagnosis not present

## 2021-12-12 DIAGNOSIS — F411 Generalized anxiety disorder: Secondary | ICD-10-CM | POA: Diagnosis not present
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
## 2021-12-21 ENCOUNTER — Telehealth: Payer: Self-pay | Admitting: Clinical

## 2021-12-21 NOTE — Telephone Encounter (Signed)
LCSW received call with pt's request for FMLA that was left on vm. I attempted to call pt back and left vm letting her know this was sent to her PCP. ?

## 2021-12-27 ENCOUNTER — Other Ambulatory Visit: Payer: Self-pay

## 2021-12-27 ENCOUNTER — Ambulatory Visit: Payer: BC Managed Care – PPO | Admitting: Clinical

## 2022-01-03 ENCOUNTER — Ambulatory Visit (INDEPENDENT_AMBULATORY_CARE_PROVIDER_SITE_OTHER): Payer: BC Managed Care – PPO | Admitting: Clinical

## 2022-01-03 DIAGNOSIS — F331 Major depressive disorder, recurrent, moderate: Secondary | ICD-10-CM

## 2022-01-03 DIAGNOSIS — F411 Generalized anxiety disorder: Secondary | ICD-10-CM

## 2022-01-04 ENCOUNTER — Encounter: Payer: Self-pay | Admitting: Family Medicine

## 2022-01-04 NOTE — BH Specialist Note (Signed)
Pt came in but needed to leave and reschedule. LCSW rescheduled appt with pt.  ?
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
## 2022-01-17 ENCOUNTER — Ambulatory Visit: Payer: BC Managed Care – PPO | Admitting: Clinical

## 2022-01-27 ENCOUNTER — Telehealth: Payer: Self-pay | Admitting: Clinical

## 2022-01-27 NOTE — Telephone Encounter (Signed)
I attempted to call pt, left vm informing her that I sent resources to her mychart. ?

## 2022-01-30 ENCOUNTER — Ambulatory Visit
Admission: EM | Admit: 2022-01-30 | Discharge: 2022-01-30 | Disposition: A | Discharge status: Home / Self Care | Payer: BC Managed Care – PPO | Attending: Internal Medicine | Admitting: Internal Medicine

## 2022-01-30 ENCOUNTER — Encounter: Payer: Self-pay | Admitting: Emergency Medicine

## 2022-01-30 DIAGNOSIS — H65193 Other acute nonsuppurative otitis media, bilateral: Secondary | ICD-10-CM

## 2022-01-30 MED ORDER — CETIRIZINE HCL 10 MG PO TABS: 10.0000 mg | ORAL_TABLET | Freq: Every day | ORAL | 0 refills | Status: DC

## 2022-01-30 MED ORDER — FLUTICASONE PROPIONATE 50 MCG/ACT NA SUSP
1.0000 | Freq: Every day | NASAL | 0 refills | Status: DC
Start: 2022-01-30 — End: 2024-01-08

## 2022-01-30 NOTE — Discharge Instructions (Signed)
You have fluid behind your eardrum in both ears.  This is being treated with 2 medications.  Please follow-up if symptoms persist or worsen. ?

## 2022-01-30 NOTE — ED Provider Notes (Signed)
?[facility] ? ? ? ?CSN: [id number] ?Arrival date & time: 01/30/22  1431 ? ? ?  ? ?History   ?Chief Complaint ?Chief Complaint  ?Patient presents with  ? Otalgia  ? ? ?HPI ?Hannah Weber is a 31 y.o. female.  ? ?Patient presents with bilateral ear discomfort and pain that has been present for approximately 1 week.  She reports symptoms have been present for longer than that but have worsened over the past week.  She reports associated "allergies".  Patient reports that she takes allergy medication occasionally but has not taken any recently.  Denies any known fevers.  Denies trauma, foreign body, drainage, decreased hearing. ? ? ?Otalgia ? ?Past Medical History:  ?Diagnosis Date  ? Allergy   ? Hypertension   ? ? ?Patient Active Problem List  ? Diagnosis Date Noted  ? Hyperprolactinemia ([place]) 06/18/2020  ? Ketonuria 01/12/2018  ? Achilles tendinitis of both lower extremities 11/09/2017  ? Smoker unmotivated to quit 09/07/2015  ? ? ?History reviewed. No pertinent surgical history. ? ?OB History   ?No obstetric history on file. ?  ? ? ? ?Home Medications   ? ?Prior to Admission medications   ?Medication Sig Start Date End Date Taking? Authorizing Provider  ?acetaminophen (TYLENOL) 500 MG tablet Take 500 mg by mouth every 6 (six) hours as needed for moderate pain.   Yes [provider]  ?cetirizine (ZYRTEC) 10 MG tablet Take 1 tablet (10 mg total) by mouth daily for 10 days. 01/30/22 02/09/22 Yes Teodora Medici, FNP  ?fluticasone (FLONASE) 50 MCG/ACT nasal spray Place 1 spray into both nostrils daily for 3 days. 01/30/22 02/02/22 Yes Teodora Medici, FNP  ?Olopatadine HCl (PATADAY OP) Place 1 drop into both eyes daily as needed (itching).   Yes [provider]  ?PARoxetine (PAXIL) 20 MG tablet Take 1 tablet (20 mg total) by mouth daily. 05/23/21  Yes Dorna Mai, MD  ?azithromycin (ZITHROMAX) 250 MG tablet Take 1 tablet (250 mg total) by mouth daily. Take first 2 tablets together, then 1 every  day until finished. 04/18/21   Quintella Reichert, MD  ? ? ?Family History ?Family History  ?Problem Relation Age of Onset  ? Diabetes Mother   ? ? ?Social History ?Social History  ? ?Tobacco Use  ? Smoking status: Former  ?  Packs/day: 0.10  ?  Years: 15.00  ?  Pack years: 1.50  ?  Types: Cigarettes  ?  Start date: 2006  ?  Quit date: 10/02/2020  ?  Years since quitting: 1.3  ? Smokeless tobacco: Never  ?Vaping Use  ? Vaping Use: Never used  ?Substance Use Topics  ? Alcohol use: Yes  ?  Comment: occ  ? Drug use: Yes  ?  Types: Marijuana  ? ? ? ?Allergies   ?Apple and Other ? ? ?Review of Systems ?Review of Systems ?Per HPI ? ?Physical Exam ?Triage Vital Signs ?ED Triage Vitals [01/30/22 1510]  ?Enc Vitals Group  ?   BP (!) 134/92  ?   Pulse Rate 81  ?   Resp 18  ?   Temp 98.1 ?F (36.7 ?C)  ?   Temp Source Oral  ?   SpO2 99 %  ?   Weight 220 lb (99.8 kg)  ?   Height '5\' 8"'$  (1.727 m)  ?   Head Circumference   ?   Peak Flow   ?   Pain Score 1  ?   Pain Loc   ?  Pain Edu?   ?   Excl. in [place]?   ? ?No data found. ? ?Updated Vital Signs ?BP (!) 134/92 (BP Location: Left Arm)   Pulse 81   Temp 98.1 ?F (36.7 ?C) (Oral)   Resp 18   Ht '5\' 8"'$  (1.727 m)   Wt 220 lb (99.8 kg)   SpO2 99%   BMI 33.45 kg/m?  ? ?Visual Acuity ?Right Eye Distance:   ?Left Eye Distance:   ?Bilateral Distance:   ? ?Right Eye Near:   ?Left Eye Near:    ?Bilateral Near:    ? ?Physical Exam ?Constitutional:   ?   General: She is not in acute distress. ?   Appearance: Normal appearance. She is not toxic-appearing or diaphoretic.  ?HENT:  ?   Head: Normocephalic and atraumatic.  ?   Right Ear: Ear canal and external ear normal. No drainage, swelling or tenderness. A middle ear effusion is present. No mastoid tenderness. Tympanic membrane is not perforated, erythematous or bulging.  ?   Left Ear: Ear canal and external ear normal. No drainage, swelling or tenderness. A middle ear effusion is present. No mastoid tenderness. Tympanic membrane is not  perforated, erythematous or bulging.  ?Eyes:  ?   Extraocular Movements: Extraocular movements intact.  ?   Conjunctiva/sclera: Conjunctivae normal.  ?Pulmonary:  ?   Effort: Pulmonary effort is normal.  ?Neurological:  ?   General: No focal deficit present.  ?   Mental Status: She is alert and oriented to person, place, and time. Mental status is at baseline.  ?Psychiatric:     ?   Mood and Affect: Mood normal.     ?   Behavior: Behavior normal.     ?   Thought Content: Thought content normal.     ?   Judgment: Judgment normal.  ? ? ? ?UC Treatments / Results  ?Labs ?(all labs ordered are listed, but only abnormal results are displayed) ?Labs Reviewed - No data to display ? ?EKG ? ? ?Radiology ?No results found. ? ?Procedures ?Procedures (including critical care time) ? ?Medications Ordered in UC ?Medications - No data to display ? ?Initial Impression / Assessment and Plan / UC Course  ?I have reviewed the triage vital signs and the nursing notes. ? ?Pertinent labs & imaging results that were available during my care of the patient were reviewed by me and considered in my medical decision making (see chart for details). ? ?  ? ?Will treat bilateral middle ear effusion with cetirizine and Flonase.  Patient denies that she takes any daily medications so this appears safe.  Patient advised to follow-up if symptoms persist or worsen.  Patient verbalized understanding and was agreeable with plan. ?Final Clinical Impressions(s) / UC Diagnoses  ? ?Final diagnoses:  ?Acute MEE (middle ear effusion), bilateral  ? ? ? ?Discharge Instructions   ? ?  ?You have fluid behind your eardrum in both ears.  This is being treated with 2 medications.  Please follow-up if symptoms persist or worsen. ? ? ? ?ED Prescriptions   ? ? Medication Sig Dispense Auth. Provider  ? cetirizine (ZYRTEC) 10 MG tablet Take 1 tablet (10 mg total) by mouth daily for 10 days. 30 tablet Teodora Medici, [place]  ? fluticasone (FLONASE) 50 MCG/ACT nasal spray  Place 1 spray into both nostrils daily for 3 days. 16 g Teodora Medici, [place]  ? ?  ? ?PDMP not reviewed this encounter. ?  ?Teodora Medici, FNP ?  01/30/22 1530 ? ?

## 2022-01-30 NOTE — ED Triage Notes (Signed)
Patient c/o bilateral ear pain x 1 week.  No other sx's. ?

## 2022-02-21 ENCOUNTER — Ambulatory Visit
Admission: EM | Admit: 2022-02-21 | Discharge: 2022-02-21 | Disposition: A | Discharge status: Home / Self Care | Payer: BC Managed Care – PPO | Attending: Student | Admitting: Student

## 2022-02-21 ENCOUNTER — Encounter: Payer: Self-pay | Admitting: Emergency Medicine

## 2022-02-21 DIAGNOSIS — M79621 Pain in right upper arm: Secondary | ICD-10-CM | POA: Diagnosis not present

## 2022-02-21 MED ORDER — CEPHALEXIN 500 MG PO CAPS: 500.0000 mg | ORAL_CAPSULE | Freq: Four times a day (QID) | ORAL | 0 refills | Status: DC

## 2022-02-21 NOTE — ED Provider Notes (Signed)
[name]    CSN: [id number] Arrival date & time: 02/21/22  0857      History   Chief Complaint Chief Complaint  Patient presents with   Flank Pain    HPI Hannah Weber is a 31 y.o. female presenting with pain radiating from the breast to the right axilla for 6 days.  History hyperprolactinemia.  She states that there was some pus coming out of her nipple, and she developed some pain over the nipple radiating to the right axilla.  She has not seen the nipple dark discharge in a few days, but is still having the armpit pain.  She states the pain is not exacerbated by movement, and she denies recent trauma or known pulled muscles.  She states that it is a dull pain, not sharp or crampy or burning.  HPI  Past Medical History:  Diagnosis Date   Allergy    Hypertension     Patient Active Problem List   Diagnosis Date Noted   Hyperprolactinemia ([place]) 06/18/2020   Ketonuria 01/12/2018   Achilles tendinitis of both lower extremities 11/09/2017   Smoker unmotivated to quit 09/07/2015    History reviewed. No pertinent surgical history.  OB History   No obstetric history on file.      Home Medications    Prior to Admission medications   Medication Sig Start Date End Date Taking? Authorizing Provider  cephALEXin (KEFLEX) 500 MG capsule Take 1 capsule (500 mg total) by mouth 4 (four) times daily. 02/21/22  Yes Hazel Sams, PA-C  acetaminophen (TYLENOL) 500 MG tablet Take 500 mg by mouth every 6 (six) hours as needed for moderate pain.    [provider]  cetirizine (ZYRTEC) 10 MG tablet Take 1 tablet (10 mg total) by mouth daily for 10 days. 01/30/22 02/09/22  Teodora Medici, FNP  fluticasone (FLONASE) 50 MCG/ACT nasal spray Place 1 spray into both nostrils daily for 3 days. 01/30/22 02/02/22  Teodora Medici, FNP  Olopatadine HCl (PATADAY OP) Place 1 drop into both eyes daily as needed (itching).    [provider]  PARoxetine (PAXIL) 20 MG  tablet Take 1 tablet (20 mg total) by mouth daily. 05/23/21   Dorna Mai, MD    Family History Family History  Problem Relation Age of Onset   Diabetes Mother     Social History Social History   Tobacco Use   Smoking status: Former    Packs/day: 0.10    Years: 15.00    Pack years: 1.50    Types: Cigarettes    Start date: 2006    Quit date: 10/02/2020    Years since quitting: 1.3   Smokeless tobacco: Never  Vaping Use   Vaping Use: Never used  Substance Use Topics   Alcohol use: Yes    Comment: occ   Drug use: Yes    Types: Marijuana     Allergies   Apple and Other   Review of Systems Review of Systems  Musculoskeletal:        R axillary pain  All other systems reviewed and are negative.   Physical Exam Triage Vital Signs ED Triage Vitals  Enc Vitals Group     BP 02/21/22 0959 130/84     Pulse Rate 02/21/22 0959 73     Resp 02/21/22 0959 17     Temp 02/21/22 0959 97.7 F (36.5 C)     Temp src --      SpO2 02/21/22 0959  98 %     Weight --      Height --      Head Circumference --      Peak Flow --      Pain Score 02/21/22 0958 3     Pain Loc --      Pain Edu? --      Excl. in [place]? --    No data found.  Updated Vital Signs BP 130/84   Pulse 73   Temp 97.7 F (36.5 C)   Resp 17   SpO2 98%   Visual Acuity Right Eye Distance:   Left Eye Distance:   Bilateral Distance:    Right Eye Near:   Left Eye Near:    Bilateral Near:     Physical Exam Vitals reviewed.  Constitutional:      General: She is not in acute distress.    Appearance: Normal appearance. She is not ill-appearing.  HENT:     Head: Normocephalic and atraumatic.  Pulmonary:     Effort: Pulmonary effort is normal.  Chest:     Comments: R breast: no skin changes or swelling. No nipple changes or swelling. R axilla with one small mobile minimally tender LN.  Neurological:     General: No focal deficit present.     Mental Status: She is alert and oriented to person, place,  and time.  Psychiatric:        Mood and Affect: Mood normal.        Behavior: Behavior normal.        Thought Content: Thought content normal.        Judgment: Judgment normal.     UC Treatments / Results  Labs (all labs ordered are listed, but only abnormal results are displayed) Labs Reviewed - No data to display  EKG   Radiology No results found.  Procedures Procedures (including critical care time)  Medications Ordered in UC Medications - No data to display  Initial Impression / Assessment and Plan / UC Course  I have reviewed the triage vital signs and the nursing notes.  Pertinent labs & imaging results that were available during my care of the patient were reviewed by me and considered in my medical decision making (see chart for details).     This patient is a very pleasant 31 y.o. year old female presenting with R axillary lymphadenopathy. Afebrile, nontachy. Pt describes symptoms consistent with mastitis and axillary lymphadenopathy, though at time of visit there is no apparent infection. She is not breastfeeding. Discussed options for treatment - watchful waiting vs abx. Will proceed with keflex. F/u if symptoms persist. She does have a history of hyperprolactinemia. ED return precautions discussed. Patient verbalizes understanding and agreement.  .   Final Clinical Impressions(s) / UC Diagnoses   Final diagnoses:  Pain in right axilla     Discharge Instructions      -I think that you had an infection in the right nipple, which caused the lymph node pain in the armpit. We're going to treat for this with an antibiotic, though the breast looks fairly normal today (which is a good thing!) -Start the antibiotic: Keflex, 4x daily x5 days. You can take this with food if you have a sensitive stomach. -Warm compresses if you notice any more pus -Follow-up if symptoms persist despite treatment   ED Prescriptions     Medication Sig Dispense Auth. Provider    cephALEXin (KEFLEX) 500 MG capsule Take 1 capsule (500 mg total) by  mouth 4 (four) times daily. 20 capsule Hazel Sams, PA-C      PDMP not reviewed this encounter.   Hazel Sams, PA-C 02/21/22 1045

## 2022-02-21 NOTE — ED Triage Notes (Signed)
Pt is present today with c/o aching pain that radiates from her right side to her breast. Pt states that she noticed the pain last Thursday

## 2022-02-21 NOTE — Discharge Instructions (Addendum)
-  I think that you had an infection in the right nipple, which caused the lymph node pain in the armpit. We're going to treat for this with an antibiotic, though the breast looks fairly normal today (which is a good thing!) -Start the antibiotic: Keflex, 4x daily x5 days. You can take this with food if you have a sensitive stomach. -Warm compresses if you notice any more pus -Follow-up if symptoms persist despite treatment

## 2022-02-23 ENCOUNTER — Ambulatory Visit: Payer: BC Managed Care – PPO | Admitting: Family Medicine

## 2022-02-28 ENCOUNTER — Ambulatory Visit: Payer: BC Managed Care – PPO | Admitting: Family Medicine

## 2022-02-28 ENCOUNTER — Encounter: Payer: Self-pay | Admitting: Family Medicine

## 2022-02-28 VITALS — BP 129/86 | HR 91 | Temp 98.3°F | Resp 16 | Wt 229.1 lb

## 2022-02-28 DIAGNOSIS — R590 Localized enlarged lymph nodes: Secondary | ICD-10-CM | POA: Diagnosis not present

## 2022-02-28 DIAGNOSIS — N644 Mastodynia: Secondary | ICD-10-CM | POA: Diagnosis not present

## 2022-02-28 NOTE — Progress Notes (Unsigned)
Patient is her for breast exam . Patient c/o seeing a little discharge coming from her right breast. Patient denies fever,redness, or hot to touch

## 2022-03-01 ENCOUNTER — Encounter: Payer: Self-pay | Admitting: Family Medicine

## 2022-03-01 ENCOUNTER — Other Ambulatory Visit: Payer: Self-pay | Admitting: Family Medicine

## 2022-03-01 DIAGNOSIS — R590 Localized enlarged lymph nodes: Secondary | ICD-10-CM

## 2022-03-01 DIAGNOSIS — N644 Mastodynia: Secondary | ICD-10-CM

## 2022-03-01 NOTE — Progress Notes (Signed)
Established Patient Office Visit  Subjective    Patient ID: JAE BRUCK, female    DOB: 26-Sep-1991  Age: 31 y.o. MRN: [id number]  CC:  Chief Complaint  Patient presents with   Breast Problem    HPI Hannah Weber presents with complaint of right breast pain for about 2 weeks. She also notes some nipple discharge. Denies known trauma or injury. Denies fever/chills. She was recently prescribed Keflex for her sx.with no resolution.    Outpatient Encounter Medications as of 02/28/2022  Medication Sig   acetaminophen (TYLENOL) 500 MG tablet Take 500 mg by mouth every 6 (six) hours as needed for moderate pain.   cephALEXin (KEFLEX) 500 MG capsule Take 1 capsule (500 mg total) by mouth 4 (four) times daily.   cetirizine (ZYRTEC) 10 MG tablet Take 1 tablet (10 mg total) by mouth daily for 10 days.   fluticasone (FLONASE) 50 MCG/ACT nasal spray Place 1 spray into both nostrils daily for 3 days.   ibuprofen (ADVIL) 400 MG tablet SMARTSIG:1-2 Tablet(s) By Mouth 3-4 Times Daily PRN   Olopatadine HCl (PATADAY OP) Place 1 drop into both eyes daily as needed (itching).   PARoxetine (PAXIL) 20 MG tablet Take 1 tablet (20 mg total) by mouth daily.   SF 5000 PLUS 1.1 % CREA dental cream SMARTSIG:Sparingly By Mouth Daily   No facility-administered encounter medications on file as of 02/28/2022.    Past Medical History:  Diagnosis Date   Allergy    Hypertension     No past surgical history on file.  Family History  Problem Relation Age of Onset   Diabetes Mother     Social History   Socioeconomic History   Marital status: Significant Other    Spouse name: Mertie Clause   Number of children: Not on file   Years of education: Not on file   Highest education level: Not on file  Occupational History   Occupation: customer service    Employer: [employer]  Tobacco Use   Smoking status: Former    Packs/day: 0.10    Years: 15.00    Pack years: 1.50    Types: Cigarettes    Start date: 2006     Quit date: 10/02/2020    Years since quitting: 1.4   Smokeless tobacco: Never  Vaping Use   Vaping Use: Never used  Substance and Sexual Activity   Alcohol use: Yes    Comment: occ   Drug use: Yes    Types: Marijuana   Sexual activity: Never    Birth control/protection: None  Other Topics Concern   Not on file  Social History Narrative   Not on file   Social Determinants of Health   Financial Resource Strain: Not on file  Food Insecurity: Not on file  Transportation Needs: Not on file  Physical Activity: Not on file  Stress: Not on file  Social Connections: Not on file  Intimate Partner Violence: Not on file    Review of Systems  All other systems reviewed and are negative.      Objective    BP 129/86   Pulse 91   Temp 98.3 F (36.8 C) (Oral)   Resp 16   Wt 229 lb 1.6 oz (103.9 kg)   SpO2 97%   BMI 34.83 kg/m   Physical Exam Vitals and nursing note reviewed.  Constitutional:      General: She is not in acute distress. Cardiovascular:     Rate and Rhythm: Normal rate and  regular rhythm.  Pulmonary:     Effort: Pulmonary effort is normal.     Breath sounds: Normal breath sounds.  Chest:  Breasts:    Right: Tenderness present. No mass, nipple discharge or skin change.     Left: Normal.  Lymphadenopathy:     Upper Body:     Right upper body: Axillary adenopathy present.     Left upper body: No axillary adenopathy.  Neurological:     General: No focal deficit present.     Mental Status: She is alert and oriented to person, place, and time.        Assessment & Plan:   1. Breast pain, right Referral for mammogram - [facility]; Future  2. Axillary lymphadenopathy As above - [facility]; Future    No follow-ups on file.   Becky Sax, MD

## 2022-03-06 ENCOUNTER — Other Ambulatory Visit: Payer: BC Managed Care – PPO

## 2022-03-06 ENCOUNTER — Ambulatory Visit
Admission: RE | Admit: 2022-03-06 | Discharge: 2022-03-06 | Disposition: A | Discharge status: Home / Self Care | Payer: BC Managed Care – PPO | Source: Ambulatory Visit | Attending: Family Medicine | Admitting: Family Medicine

## 2022-03-06 DIAGNOSIS — N644 Mastodynia: Secondary | ICD-10-CM | POA: Diagnosis not present

## 2022-03-06 DIAGNOSIS — R590 Localized enlarged lymph nodes: Secondary | ICD-10-CM

## 2022-03-08 ENCOUNTER — Telehealth: Payer: Self-pay | Admitting: Family Medicine

## 2022-03-08 ENCOUNTER — Other Ambulatory Visit: Payer: Self-pay | Admitting: Family

## 2022-03-08 DIAGNOSIS — N644 Mastodynia: Secondary | ICD-10-CM

## 2022-03-08 DIAGNOSIS — R590 Localized enlarged lymph nodes: Secondary | ICD-10-CM

## 2022-03-08 NOTE — Telephone Encounter (Signed)
Copied from [place] [id number]. Topic: General - Other >> Mar 08, 2022 12:08 PM McGill, Nelva Bush wrote: Reason for CRM: [place] from [place] stated please send an order for ultrasound of the breast bilateral as it was not attached in request for a diagnosticmammogram and ultrasound.  Fax- [phone]

## 2022-03-08 NOTE — Telephone Encounter (Signed)
Thank you :)

## 2022-03-13 ENCOUNTER — Other Ambulatory Visit: Payer: BC Managed Care – PPO

## 2022-07-08 ENCOUNTER — Ambulatory Visit
Admission: RE | Admit: 2022-07-08 | Discharge: 2022-07-08 | Disposition: A | Discharge status: Home / Self Care | Payer: BC Managed Care – PPO | Source: Ambulatory Visit | Attending: Internal Medicine | Admitting: Internal Medicine

## 2022-07-08 VITALS — BP 141/83 | HR 78 | Temp 97.6°F | Resp 18

## 2022-07-08 DIAGNOSIS — R109 Unspecified abdominal pain: Secondary | ICD-10-CM

## 2022-07-08 DIAGNOSIS — R300 Dysuria: Secondary | ICD-10-CM | POA: Diagnosis not present

## 2022-07-08 LAB — POCT URINALYSIS DIP (MANUAL ENTRY)
Bilirubin, UA: NEGATIVE
Blood, UA: NEGATIVE
Glucose, UA: NEGATIVE mg/dL
Ketones, POC UA: NEGATIVE mg/dL
Leukocytes, UA: NEGATIVE
Nitrite, UA: NEGATIVE
Protein Ur, POC: NEGATIVE mg/dL
Spec Grav, UA: >=1.03 — AB (ref 1.010–1.025)
Urobilinogen, UA: 0.2 E.U./dL
pH, UA: 5.5 (ref 5.0–8.0)

## 2022-07-08 NOTE — ED Provider Notes (Signed)
[facility]    CSN: [id number] Arrival date & time: 07/08/22  1348      History   Chief Complaint Chief Complaint  Patient presents with   Back Pain   Dysuria    HPI Hannah Weber is a 31 y.o. female.   Patient presents with right lower back pain and mild dysuria that started about a week ago.  Denies urinary frequency, vaginal discharge, abnormal vaginal bleeding, fever.  Does report some mild lower abdominal pain.  Back pain does not exacerbate with movement.  Back pain is constant and is rated 3/10 on pain scale.  Denies any obvious injury to the back.  Last menstrual cycle was approximately 1 month ago.  Patient denies any exposure to STD or any recent unprotected sexual intercourse.   Back Pain Dysuria   Past Medical History:  Diagnosis Date   Allergy    Hypertension     Patient Active Problem List   Diagnosis Date Noted   Otalgia, left 03/10/2021   Temporomandibular joint dysfunction 03/10/2021   Tinnitus of both ears 03/10/2021   Hyperprolactinemia ([place]) 06/18/2020   Ketonuria 01/12/2018   Achilles tendinitis of both lower extremities 11/09/2017   Smoker unmotivated to quit 09/07/2015    History reviewed. No pertinent surgical history.  OB History   No obstetric history on file.      Home Medications    Prior to Admission medications   Medication Sig Start Date End Date Taking? Authorizing Provider  acetaminophen (TYLENOL) 500 MG tablet Take 500 mg by mouth every 6 (six) hours as needed for moderate pain.    [provider]  cephALEXin (KEFLEX) 500 MG capsule Take 1 capsule (500 mg total) by mouth 4 (four) times daily. 02/21/22   Hazel Sams, PA-C  cetirizine (ZYRTEC) 10 MG tablet Take 1 tablet (10 mg total) by mouth daily for 10 days. 01/30/22 02/09/22  Teodora Medici, FNP  fluticasone (FLONASE) 50 MCG/ACT nasal spray Place 1 spray into both nostrils daily for 3 days. 01/30/22 02/02/22  Teodora Medici, FNP  ibuprofen (ADVIL) 400  MG tablet SMARTSIG:1-2 Tablet(s) By Mouth 3-4 Times Daily PRN 10/03/21   [provider]  Olopatadine HCl (PATADAY OP) Place 1 drop into both eyes daily as needed (itching).    [provider]  PARoxetine (PAXIL) 20 MG tablet Take 1 tablet (20 mg total) by mouth daily. 05/23/21   Dorna Mai, MD  SF 5000 PLUS 1.1 % CREA dental cream SMARTSIG:Sparingly By Mouth Daily 10/03/21   [provider]    Family History Family History  Problem Relation Age of Onset   Diabetes Mother     Social History Social History   Tobacco Use   Smoking status: Former    Packs/day: 0.10    Years: 15.00    Total pack years: 1.50    Types: Cigarettes    Start date: 2006    Quit date: 10/02/2020    Years since quitting: 1.7   Smokeless tobacco: Never  Vaping Use   Vaping Use: Never used  Substance Use Topics   Alcohol use: Yes    Comment: occ   Drug use: Yes    Types: Marijuana     Allergies   Apple and Other   Review of Systems Review of Systems Per HPI  Physical Exam Triage Vital Signs ED Triage Vitals  Enc Vitals Group     BP 07/08/22 1402 (!) 141/83     Pulse Rate 07/08/22  1402 78     Resp 07/08/22 1402 18     Temp 07/08/22 1402 97.6 F (36.4 C)     Temp src --      SpO2 07/08/22 1402 98 %     Weight --      Height --      Head Circumference --      Peak Flow --      Pain Score 07/08/22 1404 3     Pain Loc --      Pain Edu? --      Excl. in [place]? --    No data found.  Updated Vital Signs BP (!) 141/83   Pulse 78   Temp 97.6 F (36.4 C)   Resp 18   SpO2 98%   Visual Acuity Right Eye Distance:   Left Eye Distance:   Bilateral Distance:    Right Eye Near:   Left Eye Near:    Bilateral Near:     Physical Exam Constitutional:      General: She is not in acute distress.    Appearance: Normal appearance. She is not toxic-appearing or diaphoretic.  HENT:     Head: Normocephalic and atraumatic.  Eyes:     Extraocular Movements: Extraocular  movements intact.     Conjunctiva/sclera: Conjunctivae normal.  Cardiovascular:     Rate and Rhythm: Normal rate and regular rhythm.     Pulses: Normal pulses.     Heart sounds: Normal heart sounds.  Pulmonary:     Effort: Pulmonary effort is normal. No respiratory distress.     Breath sounds: Normal breath sounds.  Abdominal:     General: Bowel sounds are normal. There is no distension.     Palpations: Abdomen is soft.     Tenderness: There is no abdominal tenderness.  Genitourinary:    Comments: Deferred with shared decision making. Self swab performed.  Musculoskeletal:     Lumbar back: No tenderness.     Comments: No tenderness to palpation to right lower back or right thoracic back where patient has been experiencing pain.   Neurological:     General: No focal deficit present.     Mental Status: She is alert and oriented to person, place, and time. Mental status is at baseline.  Psychiatric:        Mood and Affect: Mood normal.        Behavior: Behavior normal.        Thought Content: Thought content normal.        Judgment: Judgment normal.      UC Treatments / Results  Labs (all labs ordered are listed, but only abnormal results are displayed) Labs Reviewed  POCT URINALYSIS DIP (MANUAL ENTRY) - Abnormal; Notable for the following components:      Result Value   Spec Grav, UA >=1.030 (*)    All other components within normal limits  URINE CULTURE  CERVICOVAGINAL ANCILLARY ONLY    EKG   Radiology No results found.  Procedures Procedures (including critical care time)  Medications Ordered in UC Medications - No data to display  Initial Impression / Assessment and Plan / UC Course  I have reviewed the triage vital signs and the nursing notes.  Pertinent labs & imaging results that were available during my care of the patient were reviewed by me and considered in my medical decision making (see chart for details).     Urinalysis not showing signs of  urinary tract infection.  Differential diagnoses  include acute cystitis versus kidney stone versus musculoskeletal pain versus vaginitis.   Has mild back pain as well.  There is low suspicion for kidney stone.  No tenderness to palpation in abdomen on exam concerning for acute abdomen so no need for emergent evaluation or acute imaging of the abdomen.  Will send cervicovaginal swab to rule out vaginitis as cause of patient's symptoms. Patient denied concerns for pregnancy or STD so will defer these testing at this time.  Concerned that urine culture is warranted given the patient is having flank pain and dysuria as this can be consistent with cystitis so will send urine culture to confirm this for this patient.  Urine culture also being sent given that back pain was not reproducible with palpation which rules out musculoskeletal cause.  Patient was advised to follow-up if symptoms persist or worsen.  Patient verbalized understanding and was agreeable with plan. Final Clinical Impressions(s) / UC Diagnoses   Final diagnoses:  Dysuria  Flank pain     Discharge Instructions      Urine did not show any signs of infection.  Your vaginal swab and culture pending.  We will call if it is abnormal.  Please follow-up if symptoms persist or worsen.    ED Prescriptions   None    PDMP not reviewed this encounter.   Teodora Medici, [place] 07/08/22 [phone]

## 2022-07-08 NOTE — ED Triage Notes (Signed)
Pt is present today with c/o lower right back pain and dysuria. Pt sx started one week ago

## 2022-07-08 NOTE — Discharge Instructions (Addendum)
Urine did not show any signs of infection.  Your vaginal swab and culture pending.  We will call if it is abnormal.  Please follow-up if symptoms persist or worsen.

## 2022-07-09 LAB — URINE CULTURE: Culture: NO GROWTH

## 2022-07-10 LAB — CERVICOVAGINAL ANCILLARY ONLY
Bacterial Vaginitis (gardnerella): NEGATIVE
Candida Glabrata: NEGATIVE
Candida Vaginitis: NEGATIVE
Comment: NEGATIVE
Comment: NEGATIVE
Comment: NEGATIVE

## 2022-07-18 ENCOUNTER — Encounter (HOSPITAL_BASED_OUTPATIENT_CLINIC_OR_DEPARTMENT_OTHER): Payer: Self-pay

## 2022-07-18 ENCOUNTER — Emergency Department (HOSPITAL_BASED_OUTPATIENT_CLINIC_OR_DEPARTMENT_OTHER): Payer: BC Managed Care – PPO

## 2022-07-18 ENCOUNTER — Emergency Department (HOSPITAL_BASED_OUTPATIENT_CLINIC_OR_DEPARTMENT_OTHER)
Admission: EM | Admit: 2022-07-18 | Discharge: 2022-07-19 | Disposition: A | Discharge status: Home / Self Care | Payer: BC Managed Care – PPO | Attending: Emergency Medicine | Admitting: Emergency Medicine

## 2022-07-18 ENCOUNTER — Other Ambulatory Visit: Payer: Self-pay

## 2022-07-18 DIAGNOSIS — N898 Other specified noninflammatory disorders of vagina: Secondary | ICD-10-CM | POA: Insufficient documentation

## 2022-07-18 DIAGNOSIS — I10 Essential (primary) hypertension: Secondary | ICD-10-CM | POA: Diagnosis not present

## 2022-07-18 DIAGNOSIS — R109 Unspecified abdominal pain: Secondary | ICD-10-CM

## 2022-07-18 DIAGNOSIS — R300 Dysuria: Secondary | ICD-10-CM | POA: Insufficient documentation

## 2022-07-18 DIAGNOSIS — R000 Tachycardia, unspecified: Secondary | ICD-10-CM | POA: Insufficient documentation

## 2022-07-18 DIAGNOSIS — R110 Nausea: Secondary | ICD-10-CM | POA: Diagnosis not present

## 2022-07-18 DIAGNOSIS — R1031 Right lower quadrant pain: Secondary | ICD-10-CM | POA: Insufficient documentation

## 2022-07-18 DIAGNOSIS — D72829 Elevated white blood cell count, unspecified: Secondary | ICD-10-CM | POA: Diagnosis not present

## 2022-07-18 LAB — URINALYSIS, ROUTINE W REFLEX MICROSCOPIC
Bilirubin Urine: NEGATIVE
Glucose, UA: NEGATIVE mg/dL
Hgb urine dipstick: NEGATIVE
Ketones, ur: NEGATIVE mg/dL
Leukocytes,Ua: NEGATIVE
Nitrite: NEGATIVE
Protein, ur: NEGATIVE mg/dL
Specific Gravity, Urine: 1.018 (ref 1.005–1.030)
pH: 7 (ref 5.0–8.0)

## 2022-07-18 LAB — PREGNANCY, URINE: Preg Test, Ur: NEGATIVE

## 2022-07-18 LAB — CBC
HCT: 38.7 % (ref 36.0–46.0)
Hemoglobin: 13.8 g/dL (ref 12.0–15.0)
MCH: 29.7 pg (ref 26.0–34.0)
MCHC: 35.7 g/dL (ref 30.0–36.0)
MCV: 83.2 fL (ref 80.0–100.0)
Platelets: 313 10*3/uL (ref 150–400)
RBC: 4.65 MIL/uL (ref 3.87–5.11)
RDW: 13.8 % (ref 11.5–15.5)
WBC: 11 10*3/uL — ABNORMAL HIGH (ref 4.0–10.5)
nRBC: 0 % (ref 0.0–0.2)

## 2022-07-18 LAB — COMPREHENSIVE METABOLIC PANEL
ALT: 21 U/L (ref 0–44)
AST: 20 U/L (ref 15–41)
Albumin: 4.6 g/dL (ref 3.5–5.0)
Alkaline Phosphatase: 54 U/L (ref 38–126)
Anion gap: 8 (ref 5–15)
BUN: 12 mg/dL (ref 6–20)
CO2: 25 mmol/L (ref 22–32)
Calcium: 9.5 mg/dL (ref 8.9–10.3)
Chloride: 105 mmol/L (ref 98–111)
Creatinine, Ser: 0.96 mg/dL (ref 0.44–1.00)
GFR, Estimated: >60 mL/min (ref >60)
Glucose, Bld: 108 mg/dL — ABNORMAL HIGH (ref 70–99)
Potassium: 3.5 mmol/L (ref 3.5–5.1)
Sodium: 138 mmol/L (ref 135–145)
Total Bilirubin: 0.3 mg/dL (ref 0.3–1.2)
Total Protein: 7.9 g/dL (ref 6.5–8.1)

## 2022-07-18 LAB — LIPASE, BLOOD: Lipase: 15 U/L (ref 11–51)

## 2022-07-18 MED ORDER — MORPHINE SULFATE (PF) 4 MG/ML IV SOLN
4.0000 mg | Freq: Once | INTRAVENOUS | Status: DC
  Filled 2022-07-18: qty 1

## 2022-07-18 MED ORDER — SODIUM CHLORIDE 0.9 % IV BOLUS (SEPSIS)
1000.0000 mL | Freq: Once | INTRAVENOUS | Status: AC
  Administered 2022-07-18: 1000 mL via INTRAVENOUS

## 2022-07-18 MED ORDER — SODIUM CHLORIDE 0.9 % IV SOLN: 1000.0000 mL | INTRAVENOUS | Status: DC

## 2022-07-18 MED ORDER — IOHEXOL 300 MG/ML  SOLN
100.0000 mL | Freq: Once | INTRAMUSCULAR | Status: AC | PRN
  Administered 2022-07-18: 80 mL via INTRAVENOUS

## 2022-07-18 NOTE — ED Provider Notes (Incomplete)
[facility] EM[facility] DEPT Provider Note  CSN: [id number] Arrival date & time: 07/18/22 2251  Chief Complaint(s) Abdominal Pain  HPI Hannah Weber is a 31 y.o. female    The history is provided by the patient.  Abdominal Pain Pain location:  RLQ and R flank Pain quality: aching and cramping   Pain severity:  Mild Onset quality:  Gradual Duration:  2 weeks Timing:  Intermittent Progression:  Waxing and waning Chronicity:  New Context comment:  Started a few days after drinking 1 bottle of wine by herself. Relieved by:  Nothing Worsened by:  Palpation Associated symptoms: dysuria, nausea (mild) and vaginal discharge (on her cycle)   Associated symptoms: no chest pain, no chills, no constipation, no cough, no diarrhea, no fatigue, no fever, no hematuria, no melena, no vaginal bleeding and no vomiting    She denies being a daily etoh drinker. Last drank 2 weeks ago. Admits to intermittent THC use. No other drugs. Not taking any meds.  Past Medical History Past Medical History:  Diagnosis Date  . Allergy   . Hypertension    Patient Active Problem List   Diagnosis Date Noted  . Otalgia, left 03/10/2021  . Temporomandibular joint dysfunction 03/10/2021  . Tinnitus of both ears 03/10/2021  . Hyperprolactinemia ([place]) 06/18/2020  . Ketonuria 01/12/2018  . Achilles tendinitis of both lower extremities 11/09/2017  . Smoker unmotivated to quit 09/07/2015   Home Medication(s) Prior to Admission medications   Medication Sig Start Date End Date Taking? Authorizing Provider  acetaminophen (TYLENOL) 500 MG tablet Take 500 mg by mouth every 6 (six) hours as needed for moderate pain.    [provider]  cephALEXin (KEFLEX) 500 MG capsule Take 1 capsule (500 mg total) by mouth 4 (four) times daily. 02/21/22   Hazel Sams, PA-C  cetirizine (ZYRTEC) 10 MG tablet Take 1 tablet (10 mg total) by mouth daily for 10 days. 01/30/22 02/09/22  Teodora Medici, FNP   fluticasone (FLONASE) 50 MCG/ACT nasal spray Place 1 spray into both nostrils daily for 3 days. 01/30/22 02/02/22  Teodora Medici, FNP  ibuprofen (ADVIL) 400 MG tablet SMARTSIG:1-2 Tablet(s) By Mouth 3-4 Times Daily PRN 10/03/21   [provider]  Olopatadine HCl (PATADAY OP) Place 1 drop into both eyes daily as needed (itching).    [provider]  PARoxetine (PAXIL) 20 MG tablet Take 1 tablet (20 mg total) by mouth daily. 05/23/21   Dorna Mai, MD  SF 5000 PLUS 1.1 % CREA dental cream SMARTSIG:Sparingly By Mouth Daily 10/03/21   [provider]                                                                                                                                    Allergies Apple and Other  Review of Systems Review of Systems  Constitutional:  Negative for chills, fatigue and fever.  Respiratory:  Negative for cough.  Cardiovascular:  Negative for chest pain.  Gastrointestinal:  Positive for abdominal pain and nausea (mild). Negative for constipation, diarrhea, melena and vomiting.  Genitourinary:  Positive for dysuria and vaginal discharge (on her cycle). Negative for hematuria and vaginal bleeding.   As noted in HPI  Physical Exam Vital Signs  I have reviewed the triage vital signs BP (!) 162/103 (BP Location: Right Arm)   Pulse (!) 107   Temp 98.7 F (37.1 C) (Oral)   Resp 18   Ht '5\' 8"'$  (1.727 m)   Wt 99.8 kg   SpO2 100%   BMI 33.45 kg/m   Physical Exam Vitals reviewed.  Constitutional:      General: She is not in acute distress.    Appearance: She is well-developed. She is not diaphoretic.  HENT:     Head: Normocephalic and atraumatic.     Right Ear: External ear normal.     Left Ear: External ear normal.     Nose: Nose normal.  Eyes:     General: No scleral icterus.    Conjunctiva/sclera: Conjunctivae normal.  Neck:     Trachea: Phonation normal.  Cardiovascular:     Rate and Rhythm: Regular rhythm. Tachycardia present.   Pulmonary:     Effort: Pulmonary effort is normal. No respiratory distress.     Breath sounds: No stridor.  Abdominal:     General: There is no distension.     Tenderness: There is abdominal tenderness (mid-right abd). There is no right CVA tenderness, left CVA tenderness, guarding or rebound. Negative signs include Murphy's sign and McBurney's sign.  Musculoskeletal:        General: Normal range of motion.     Cervical back: Normal range of motion.  Neurological:     Mental Status: She is alert and oriented to person, place, and time.  Psychiatric:        Behavior: Behavior normal.     ED Results and Treatments Labs (all labs ordered are listed, but only abnormal results are displayed) Labs Reviewed  COMPREHENSIVE METABOLIC PANEL - Abnormal; Notable for the following components:      Result Value   Glucose, Bld 108 (*)    All other components within normal limits  CBC - Abnormal; Notable for the following components:   WBC 11.0 (*)    All other components within normal limits  LIPASE, BLOOD  URINALYSIS, ROUTINE W REFLEX MICROSCOPIC  PREGNANCY, URINE                                                                                                                         EKG  EKG Interpretation  Date/Time:    Ventricular Rate:    PR Interval:    QRS Duration:   QT Interval:    QTC Calculation:   R Axis:     Text Interpretation:         Radiology No results found.  Medications Ordered in ED Medications  sodium chloride 0.9 %  bolus 1,000 mL (1,000 mLs Intravenous New Bag/Given 07/18/22 2327)  iohexol (OMNIPAQUE) 300 MG/ML solution 100 mL (80 mLs Intravenous Contrast Given 07/18/22 2350)                                                                                                                                     Procedures Procedures  (including critical care time)  Medical Decision Making / ED Course   Medical Decision Making Amount and/or Complexity  of Data Reviewed External Data Reviewed: notes.    Details: UC 07/08/22: Seen for dysuria. UA and vaginal swab for BV/yeast negative. Pelvic US 07/02/20: noted uterine fibroids and right ovarian hemorrhagic cyst. Labs: ordered. Decision-making details documented in ED Course. Radiology: ordered and independent interpretation performed. Decision-making details documented in ED Course.  Risk Prescription drug management. Decision regarding hospitalization.    Complexity of Problem:   Patient's presenting problem/concern, DDX, and MDM listed below: Right sided abd pain Differential includes appendicitis, urinary tract infection, renal colic, biliary disease, functional intestinal cramping, menstrual cramping, uterine fibroids or hemorrhagic cyst related pain.  We will also need to rule out pregnancy related process.  Lower concern for torsion.  Initial Intervention:  IVF    Complexity of Data:   Laboratory Tests ordered listed below with my independent interpretation: CBC with mild leukocytosis.  No anemia CMP without significant electrolyte derangements, renal insufficiency, biliary obstruction. No evidence of pancreatitis. UA without evidence of infection or hematuria UPT negative   Imaging Studies ordered listed below with my independent interpretation: ***     ED Course:    Assessment, Add'l Intervention, and Reassessment: ***  ***         Final Clinical Impression(s) / ED Diagnoses Final diagnoses:  None    {Document critical care time when appropriate:1}  {Document review of labs and clinical decision tools ie heart score, Chads2Vasc2 etc:1}  {Document your independent review of radiology images, and any outside records:1} {Document your discussion with family members, caretakers, and with consultants:1} {Document social determinants of health affecting pt's care:1} {Document your decision making why or why not admission, treatments were needed:1} This chart  was dictated using voice recognition software.  Despite best efforts to proofread,  errors can occur which can change the documentation meaning.

## 2022-07-18 NOTE — ED Triage Notes (Signed)
Pt presents to the ED with RLQ abdominal pain that has been going on for about a week. Denies N/V/D. Pt is concerned about her appendix.

## 2022-07-18 NOTE — ED Provider Notes (Signed)
[place] EMER[facility]ENC[facility] DEPT Provider Note  CSN: [id number] Arrival date & time: 07/18/22 2251  Chief Complaint(s) Abdominal Pain  HPI Hannah Weber is a 31 y.o. female {Add pertinent medical, surgical, social history, OB history to HPI:1}   The history is provided by the patient.  Abdominal Pain Pain location:  RLQ and R flank Pain quality: cramping   Pain severity:  Mild Onset quality:  Gradual Duration:  2 weeks Timing:  Intermittent Progression:  Waxing and waning Chronicity:  New Context comment:  Started a few days after drinking 1 bottle of wine by herself. Relieved by:  Nothing Worsened by:  Palpation Associated symptoms: dysuria, nausea (mild) and vaginal discharge (on her cycle)   Associated symptoms: no chest pain, no chills, no constipation, no cough, no diarrhea, no fatigue, no fever, no hematuria, no melena, no vaginal bleeding and no vomiting     Past Medical History Past Medical History:  Diagnosis Date   Allergy    Hypertension    Patient Active Problem List   Diagnosis Date Noted   Otalgia, left 03/10/2021   Temporomandibular joint dysfunction 03/10/2021   Tinnitus of both ears 03/10/2021   Hyperprolactinemia ([place]) 06/18/2020   Ketonuria 01/12/2018   Achilles tendinitis of both lower extremities 11/09/2017   Smoker unmotivated to quit 09/07/2015   Home Medication(s) Prior to Admission medications   Medication Sig Start Date End Date Taking? Authorizing Provider  acetaminophen (TYLENOL) 500 MG tablet Take 500 mg by mouth every 6 (six) hours as needed for moderate pain.    [provider]  cephALEXin (KEFLEX) 500 MG capsule Take 1 capsule (500 mg total) by mouth 4 (four) times daily. 02/21/22   Hazel Sams, PA-C  cetirizine (ZYRTEC) 10 MG tablet Take 1 tablet (10 mg total) by mouth daily for 10 days. 01/30/22 02/09/22  Teodora Medici, FNP  fluticasone (FLONASE) 50 MCG/ACT nasal spray Place 1 spray into both nostrils daily for  3 days. 01/30/22 02/02/22  Teodora Medici, FNP  ibuprofen (ADVIL) 400 MG tablet SMARTSIG:1-2 Tablet(s) By Mouth 3-4 Times Daily PRN 10/03/21   [provider]  Olopatadine HCl (PATADAY OP) Place 1 drop into both eyes daily as needed (itching).    [provider]  PARoxetine (PAXIL) 20 MG tablet Take 1 tablet (20 mg total) by mouth daily. 05/23/21   Dorna Mai, MD  SF 5000 PLUS 1.1 % CREA dental cream SMARTSIG:Sparingly By Mouth Daily 10/03/21   [provider]                                                                                                                                    Allergies Apple and Other  Review of Systems Review of Systems  Constitutional:  Negative for chills, fatigue and fever.  Respiratory:  Negative for cough.   Cardiovascular:  Negative for chest pain.  Gastrointestinal:  Positive for abdominal pain and nausea (  mild). Negative for constipation, diarrhea, melena and vomiting.  Genitourinary:  Positive for dysuria and vaginal discharge (on her cycle). Negative for hematuria and vaginal bleeding.   As noted in HPI  Physical Exam Vital Signs  I have reviewed the triage vital signs BP (!) 162/103 (BP Location: Right Arm)   Pulse (!) 121   Temp 98.7 F (37.1 C) (Oral)   Resp 18   Ht '5\' 8"'$  (1.727 m)   Wt 99.8 kg   SpO2 100%   BMI 33.45 kg/m  *** Physical Exam  ED Results and Treatments Labs (all labs ordered are listed, but only abnormal results are displayed) Labs Reviewed  COMPREHENSIVE METABOLIC PANEL - Abnormal; Notable for the following components:      Result Value   Glucose, Bld 108 (*)    All other components within normal limits  CBC - Abnormal; Notable for the following components:   WBC 11.0 (*)    All other components within normal limits  LIPASE, BLOOD  URINALYSIS, ROUTINE W REFLEX MICROSCOPIC  PREGNANCY, URINE                                                                                                                          EKG  EKG Interpretation  Date/Time:    Ventricular Rate:    PR Interval:    QRS Duration:   QT Interval:    QTC Calculation:   R Axis:     Text Interpretation:         Radiology No results found.  Medications Ordered in ED Medications  sodium chloride 0.9 % bolus 1,000 mL (1,000 mLs Intravenous New Bag/Given 07/18/22 2327)    Followed by  0.9 %  sodium chloride infusion (has no administration in time range)  morphine (PF) 4 MG/ML injection 4 mg (has no administration in time range)                                                                                                                                     Procedures Procedures  (including critical care time)  Medical Decision Making / ED Course   Medical Decision Making Amount and/or Complexity of Data Reviewed Labs: ordered.  Risk Prescription drug management.          Final Clinical Impression(s) / ED Diagnoses Final diagnoses:  None    {Document critical care time when appropriate:1}  {  Document review of labs and clinical decision tools ie heart score, Chads2Vasc2 etc:1}  {Document your independent review of radiology images, and any outside records:1} {Document your discussion with family members, caretakers, and with consultants:1} {Document social determinants of health affecting pt's care:1} {Document your decision making why or why not admission, treatments were needed:1} This chart was dictated using voice recognition software.  Despite best efforts to proofread,  errors can occur which can change the documentation meaning.

## 2022-07-19 MED ORDER — KETOROLAC TROMETHAMINE 15 MG/ML IJ SOLN
15.0000 mg | Freq: Once | INTRAMUSCULAR | Status: DC
  Filled 2022-07-19: qty 1

## 2022-07-19 NOTE — ED Notes (Signed)
Reviewed AVS/discharge instruction with patient. Time allotted for and all questions answered. Patient is agreeable for d/c and escorted to ed exit by staff.  

## 2022-07-19 NOTE — Discharge Instructions (Signed)

## 2022-07-25 ENCOUNTER — Telehealth: Payer: Self-pay | Admitting: Family Medicine

## 2022-07-25 NOTE — Telephone Encounter (Signed)
Thank you :)

## 2022-07-25 NOTE — Telephone Encounter (Signed)
Called to sched appt for Completion of Disability paperwork dropped off 07/24/22. No answer, LVM to call back to sched appt.

## 2022-08-07 ENCOUNTER — Ambulatory Visit: Payer: BC Managed Care – PPO | Admitting: Family Medicine

## 2022-08-08 ENCOUNTER — Ambulatory Visit (INDEPENDENT_AMBULATORY_CARE_PROVIDER_SITE_OTHER): Payer: BC Managed Care – PPO | Admitting: Family Medicine

## 2022-08-08 ENCOUNTER — Encounter: Payer: Self-pay | Admitting: Family Medicine

## 2022-08-08 VITALS — BP 118/84 | HR 120 | Temp 98.1°F | Resp 18 | Ht 68.0 in | Wt 226.6 lb

## 2022-08-08 DIAGNOSIS — Z0289 Encounter for other administrative examinations: Secondary | ICD-10-CM | POA: Diagnosis not present

## 2022-08-08 DIAGNOSIS — F411 Generalized anxiety disorder: Secondary | ICD-10-CM

## 2022-08-10 ENCOUNTER — Encounter: Payer: Self-pay | Admitting: Family Medicine

## 2022-08-10 NOTE — Progress Notes (Signed)
Established Patient Office Visit  Subjective    Patient ID: Hannah Weber, female    DOB: July 26, 1991  Age: 31 y.o. MRN: [id number]  CC: No chief complaint on file.   HPI Hannah Weber presents for follow up of anxiety and completion of FMLA forms. Patient reports improvements and continues with counseling and meds.    Outpatient Encounter Medications as of 08/08/2022  Medication Sig   acetaminophen (TYLENOL) 500 MG tablet Take 500 mg by mouth every 6 (six) hours as needed for moderate pain.   cephALEXin (KEFLEX) 500 MG capsule Take 1 capsule (500 mg total) by mouth 4 (four) times daily.   cetirizine (ZYRTEC) 10 MG tablet Take 1 tablet (10 mg total) by mouth daily for 10 days.   fluticasone (FLONASE) 50 MCG/ACT nasal spray Place 1 spray into both nostrils daily for 3 days.   ibuprofen (ADVIL) 400 MG tablet SMARTSIG:1-2 Tablet(s) By Mouth 3-4 Times Daily PRN   Olopatadine HCl (PATADAY OP) Place 1 drop into both eyes daily as needed (itching).   PARoxetine (PAXIL) 20 MG tablet Take 1 tablet (20 mg total) by mouth daily.   SF 5000 PLUS 1.1 % CREA dental cream SMARTSIG:Sparingly By Mouth Daily   No facility-administered encounter medications on file as of 08/08/2022.    Past Medical History:  Diagnosis Date   Allergy    Hypertension     History reviewed. No pertinent surgical history.  Family History  Problem Relation Age of Onset   Diabetes Mother     Social History   Socioeconomic History   Marital status: Significant Other    Spouse name: Mertie Clause   Number of children: Not on file   Years of education: Not on file   Highest education level: Not on file  Occupational History   Occupation: customer service    Employer: [employer]  Tobacco Use   Smoking status: Former    Packs/day: 0.10    Years: 15.00    Total pack years: 1.50    Types: Cigarettes    Start date: 2006    Quit date: 10/02/2020    Years since quitting: 1.8   Smokeless tobacco: Never  Vaping Use    Vaping Use: Never used  Substance and Sexual Activity   Alcohol use: Yes    Comment: occ   Drug use: Yes    Types: Marijuana   Sexual activity: Never    Birth control/protection: None  Other Topics Concern   Not on file  Social History Narrative   Not on file   Social Determinants of Health   Financial Resource Strain: Not on file  Food Insecurity: Not on file  Transportation Needs: Not on file  Physical Activity: Not on file  Stress: Not on file  Social Connections: Not on file  Intimate Partner Violence: Not on file    Review of Systems  All other systems reviewed and are negative.       Objective    BP 118/84   Pulse (!) 120   Temp 98.1 F (36.7 C) (Oral)   Resp 18   Ht '5\' 8"'$  (1.727 m)   Wt 226 lb 9.6 oz (102.8 kg)   SpO2 98%   BMI 34.45 kg/m   Physical Exam Vitals and nursing note reviewed.  Constitutional:      General: She is not in acute distress.    Appearance: She is obese.  Cardiovascular:     Rate and Rhythm: Normal rate and regular rhythm.  Pulmonary:     Effort: Pulmonary effort is normal.     Breath sounds: Normal breath sounds.  Neurological:     General: No focal deficit present.     Mental Status: She is alert and oriented to person, place, and time.  Psychiatric:        Mood and Affect: Mood normal. Affect is blunt.        Speech: Speech normal.        Behavior: Behavior normal.         Assessment & Plan:   1. Generalized anxiety disorder Continue present management with counseling and meds.   2. Encounter for completion of form with patient FMLA form completed    Return in about 3 months (around 11/08/2022) for follow up.   Becky Sax, MD

## 2022-10-02 ENCOUNTER — Encounter: Payer: Self-pay | Admitting: Emergency Medicine

## 2022-10-02 ENCOUNTER — Ambulatory Visit
Admission: EM | Admit: 2022-10-02 | Discharge: 2022-10-02 | Disposition: A | Discharge status: Home / Self Care | Payer: BC Managed Care – PPO | Attending: Physician Assistant | Admitting: Physician Assistant

## 2022-10-02 ENCOUNTER — Other Ambulatory Visit: Payer: Self-pay

## 2022-10-02 DIAGNOSIS — R600 Localized edema: Secondary | ICD-10-CM

## 2022-10-02 MED ORDER — PREDNISONE 20 MG PO TABS: 40.0000 mg | ORAL_TABLET | Freq: Every day | ORAL | 0 refills | Status: AC

## 2022-10-02 NOTE — ED Provider Notes (Signed)
[facility]    CSN: [id number] Arrival date & time: 10/02/22  0808      History   Chief Complaint Chief Complaint  Patient presents with   Otalgia    HPI Hannah Weber is a 32 y.o. female.   Patient here today for evaluation of right salivary gland pain and swelling and right ear pain that started about a week ago. She reports that pains started in the salivary gland and moved toward her ear. She has not had fever. She has not had other symptoms- denies sore throat. She does not report treatment.   The history is provided by the patient.  Otalgia Associated symptoms: no congestion, no cough, no diarrhea, no fever, no sore throat and no vomiting     Past Medical History:  Diagnosis Date   Allergy    Hypertension     Patient Active Problem List   Diagnosis Date Noted   Otalgia, left 03/10/2021   Temporomandibular joint dysfunction 03/10/2021   Tinnitus of both ears 03/10/2021   Hyperprolactinemia ([place]) 06/18/2020   Ketonuria 01/12/2018   Achilles tendinitis of both lower extremities 11/09/2017   Smoker unmotivated to quit 09/07/2015    History reviewed. No pertinent surgical history.  OB History   No obstetric history on file.      Home Medications    Prior to Admission medications   Medication Sig Start Date End Date Taking? Authorizing Provider  predniSONE (DELTASONE) 20 MG tablet Take 2 tablets (40 mg total) by mouth daily with breakfast for 5 days. 10/02/22 10/07/22 Yes Francene Finders, PA-C  acetaminophen (TYLENOL) 500 MG tablet Take 500 mg by mouth every 6 (six) hours as needed for moderate pain.    [provider]  cephALEXin (KEFLEX) 500 MG capsule Take 1 capsule (500 mg total) by mouth 4 (four) times daily. 02/21/22   Hazel Sams, PA-C  cetirizine (ZYRTEC) 10 MG tablet Take 1 tablet (10 mg total) by mouth daily for 10 days. 01/30/22 02/09/22  Teodora Medici, FNP  fluticasone (FLONASE) 50 MCG/ACT nasal spray Place 1 spray into both  nostrils daily for 3 days. 01/30/22 02/02/22  Teodora Medici, FNP  ibuprofen (ADVIL) 400 MG tablet SMARTSIG:1-2 Tablet(s) By Mouth 3-4 Times Daily PRN 10/03/21   [provider]  Olopatadine HCl (PATADAY OP) Place 1 drop into both eyes daily as needed (itching).    [provider]  PARoxetine (PAXIL) 20 MG tablet Take 1 tablet (20 mg total) by mouth daily. 05/23/21   Dorna Mai, MD  SF 5000 PLUS 1.1 % CREA dental cream SMARTSIG:Sparingly By Mouth Daily 10/03/21   [provider]    Family History Family History  Problem Relation Age of Onset   Diabetes Mother     Social History Social History   Tobacco Use   Smoking status: Former    Packs/day: 0.10    Years: 15.00    Total pack years: 1.50    Types: Cigarettes    Start date: 2006    Quit date: 10/02/2020    Years since quitting: 2.0   Smokeless tobacco: Never  Vaping Use   Vaping Use: Never used  Substance Use Topics   Alcohol use: Yes    Comment: occ   Drug use: Yes    Types: Marijuana     Allergies   Apple and Other   Review of Systems Review of Systems  Constitutional:  Negative for chills and fever.  HENT:  Positive for  ear pain. Negative for congestion and sore throat.   Eyes:  Negative for discharge and redness.  Respiratory:  Negative for cough, shortness of breath and wheezing.   Gastrointestinal:  Negative for diarrhea, nausea and vomiting.     Physical Exam Triage Vital Signs ED Triage Vitals [10/02/22 0838]  Enc Vitals Group     BP (!) 133/90     Pulse Rate 86     Resp 18     Temp 98.2 F (36.8 C)     Temp Source Oral     SpO2 98 %     Weight      Height      Head Circumference      Peak Flow      Pain Score 3     Pain Loc      Pain Edu?      Excl. in [place]?    No data found.  Updated Vital Signs BP (!) 133/90 (BP Location: Left Arm)   Pulse 86   Temp 98.2 F (36.8 C) (Oral)   Resp 18   SpO2 98%      Physical Exam Vitals and nursing note reviewed.   Constitutional:      General: She is not in acute distress.    Appearance: Normal appearance. She is not ill-appearing.  HENT:     Head: Normocephalic and atraumatic.     Right Ear: Tympanic membrane normal.     Left Ear: Tympanic membrane normal.     Nose: No congestion or rhinorrhea.     Mouth/Throat:     Mouth: Mucous membranes are moist.     Pharynx: No oropharyngeal exudate or posterior oropharyngeal erythema.  Eyes:     Conjunctiva/sclera: Conjunctivae normal.  Neck:     Comments: Minimal swelling appreciated to right submandibular area Cardiovascular:     Rate and Rhythm: Normal rate.  Pulmonary:     Effort: Pulmonary effort is normal. No respiratory distress.  Skin:    General: Skin is warm and dry.  Neurological:     Mental Status: She is alert.  Psychiatric:        Mood and Affect: Mood normal.        Thought Content: Thought content normal.      UC Treatments / Results  Labs (all labs ordered are listed, but only abnormal results are displayed) Labs Reviewed - No data to display  EKG   Radiology No results found.  Procedures Procedures (including critical care time)  Medications Ordered in UC Medications - No data to display  Initial Impression / Assessment and Plan / UC Course  I have reviewed the triage vital signs and the nursing notes.  Pertinent labs & imaging results that were available during my care of the patient were reviewed by me and considered in my medical decision making (see chart for details).    Steroid burst prescribed and recommended using sour candy to help hopefully alleviate any salivary gland blockage if possible. Encouraged follow up if no gradual improvement or with any further concerns.    Final Clinical Impressions(s) / UC Diagnoses   Final diagnoses:  Salivary gland swelling   Discharge Instructions   None    ED Prescriptions     Medication Sig Dispense Auth. Provider   predniSONE (DELTASONE) 20 MG tablet  Take 2 tablets (40 mg total) by mouth daily with breakfast for 5 days. 10 tablet Francene Finders, PA-C      PDMP not reviewed this  encounter.   Francene Finders, PA-C 10/02/22 [id number]

## 2022-10-02 NOTE — ED Triage Notes (Signed)
Pt here for right ear pain and pain in gland on that side x 1 week

## 2022-11-11 ENCOUNTER — Ambulatory Visit
Admission: EM | Admit: 2022-11-11 | Discharge: 2022-11-11 | Disposition: A | Discharge status: Home / Self Care | Payer: BC Managed Care – PPO | Attending: Internal Medicine | Admitting: Internal Medicine

## 2022-11-11 DIAGNOSIS — R220 Localized swelling, mass and lump, head: Secondary | ICD-10-CM | POA: Diagnosis not present

## 2022-11-11 MED ORDER — AMOXICILLIN-POT CLAVULANATE 875-125 MG PO TABS: 1.0000 | ORAL_TABLET | Freq: Two times a day (BID) | ORAL | 0 refills | Status: DC

## 2022-11-11 NOTE — ED Provider Notes (Addendum)
[name]    CSN: [id number] Arrival date & time: 11/11/22  [id number]      History   Chief Complaint Chief Complaint  Patient presents with   Facial Swelling   Neck Pain    HPI Hannah Weber is a 32 y.o. female.   Patient presents with right-sided facial/neck swelling that has been present since January.  Patient presented to urgent care on 10/02/22 for same symptoms.  She was diagnosed with salivary gland swelling of the right side and treated with prednisone.  Patient reports that symptoms improved but then returned a few days ago.  Patient denies any pain to the area but reports it is comfortable.  Denies any dental pain.  Denies any associated fever.  Has not followed up with specialist or PCP.   Neck Pain   Past Medical History:  Diagnosis Date   Allergy    Hypertension     Patient Active Problem List   Diagnosis Date Noted   Otalgia, left 03/10/2021   Temporomandibular joint dysfunction 03/10/2021   Tinnitus of both ears 03/10/2021   Hyperprolactinemia ([place]) 06/18/2020   Ketonuria 01/12/2018   Achilles tendinitis of both lower extremities 11/09/2017   Smoker unmotivated to quit 09/07/2015    No past surgical history on file.  OB History   No obstetric history on file.      Home Medications    Prior to Admission medications   Medication Sig Start Date End Date Taking? Authorizing Provider  amoxicillin-clavulanate (AUGMENTIN) 875-125 MG tablet Take 1 tablet by mouth every 12 (twelve) hours. 11/11/22  Yes Eriel Doyon, Hildred Alamin E, FNP  acetaminophen (TYLENOL) 500 MG tablet Take 500 mg by mouth every 6 (six) hours as needed for moderate pain.    [provider]  cetirizine (ZYRTEC) 10 MG tablet Take 1 tablet (10 mg total) by mouth daily for 10 days. 01/30/22 02/09/22  Teodora Medici, FNP  fluticasone (FLONASE) 50 MCG/ACT nasal spray Place 1 spray into both nostrils daily for 3 days. 01/30/22 02/02/22  Teodora Medici, FNP  ibuprofen (ADVIL) 400 MG tablet  SMARTSIG:1-2 Tablet(s) By Mouth 3-4 Times Daily PRN 10/03/21   [provider]  Olopatadine HCl (PATADAY OP) Place 1 drop into both eyes daily as needed (itching).    [provider]  PARoxetine (PAXIL) 20 MG tablet Take 1 tablet (20 mg total) by mouth daily. 05/23/21   Dorna Mai, MD  SF 5000 PLUS 1.1 % CREA dental cream SMARTSIG:Sparingly By Mouth Daily 10/03/21   [provider]    Family History Family History  Problem Relation Age of Onset   Diabetes Mother     Social History Social History   Tobacco Use   Smoking status: Former    Packs/day: 0.10    Years: 15.00    Total pack years: 1.50    Types: Cigarettes    Start date: 2006    Quit date: 10/02/2020    Years since quitting: 2.1   Smokeless tobacco: Never  Vaping Use   Vaping Use: Never used  Substance Use Topics   Alcohol use: Yes    Comment: occ   Drug use: Yes    Types: Marijuana    Comment: 2 times a week     Allergies   Apple and Other   Review of Systems Review of Systems Per HPI  Physical Exam Triage Vital Signs ED Triage Vitals  Enc Vitals Group     BP 11/11/22 0939 (!) 144/90  Pulse Rate 11/11/22 0939 99     Resp 11/11/22 0939 16     Temp 11/11/22 0939 98 F (36.7 C)     Temp src --      SpO2 11/11/22 0939 98 %     Weight --      Height --      Head Circumference --      Peak Flow --      Pain Score 11/11/22 0938 2     Pain Loc --      Pain Edu? --      Excl. in [place]? --    No data found.  Updated Vital Signs BP (!) 144/90   Pulse 99   Temp 98 F (36.7 C)   Resp 16   LMP 10/10/2022 (Approximate)   SpO2 98%   Visual Acuity Right Eye Distance:   Left Eye Distance:   Bilateral Distance:    Right Eye Near:   Left Eye Near:    Bilateral Near:     Physical Exam Constitutional:      General: She is not in acute distress.    Appearance: Normal appearance. She is not toxic-appearing or diaphoretic.  HENT:     Head: Normocephalic and atraumatic.       Comments: Patient reports uncomfortable feeling is on the right posterior jaw and extends into the neck slightly.  There is no obvious swelling.  Maybe minimal swelling.  No discoloration.  Interior mouth appears benign.  No dental infection. Eyes:     Extraocular Movements: Extraocular movements intact.     Conjunctiva/sclera: Conjunctivae normal.  Pulmonary:     Effort: Pulmonary effort is normal.  Neurological:     General: No focal deficit present.     Mental Status: She is alert and oriented to person, place, and time. Mental status is at baseline.  Psychiatric:        Mood and Affect: Mood normal.        Behavior: Behavior normal.        Thought Content: Thought content normal.        Judgment: Judgment normal.      UC Treatments / Results  Labs (all labs ordered are listed, but only abnormal results are displayed) Labs Reviewed - No data to display  EKG   Radiology No results found.  Procedures Procedures (including critical care time)  Medications Ordered in UC Medications - No data to display  Initial Impression / Assessment and Plan / UC Course  I have reviewed the triage vital signs and the nursing notes.  Pertinent labs & imaging results that were available during my care of the patient were reviewed by me and considered in my medical decision making (see chart for details).     There is possible minimal swelling to the right lateral portion of the face.  Possibly consistent with salivary gland swelling/infection.  Will treat with Augmentin.  Advised patient given that this is recurrent and persistent, she will need to follow-up with the specialist for further evaluation and management.  Swelling is questionable and is very minimal so do not think that emergent evaluation is necessary at this time. No signs of throat swelling or closing. No shortness of breath.  Discussed again chewing gum and eating sour candy which will be helpful.  Discussed return and  ER precautions.  Patient verbalized understanding and was agreeable with plan. Final Clinical Impressions(s) / UC Diagnoses   Final diagnoses:  Facial swelling  Discharge Instructions      I have prescribed an antibiotic to treat symptoms today.  Follow-up with ear, nose, throat specialist for further evaluation and management.    ED Prescriptions     Medication Sig Dispense Auth. Provider   amoxicillin-clavulanate (AUGMENTIN) 875-125 MG tablet Take 1 tablet by mouth every 12 (twelve) hours. 14 tablet [place], Michele Rockers, [place]      PDMP not reviewed this encounter.   Teodora Medici, [place] 11/11/22 [place], [place], [place] 11/11/22 1059

## 2022-11-11 NOTE — Discharge Instructions (Signed)
I have prescribed an antibiotic to treat symptoms today.  Follow-up with ear, nose, throat specialist for further evaluation and management.

## 2022-11-11 NOTE — ED Triage Notes (Signed)
Pt presents to uc with co of right sided nec k/ face pain since jan that has wax and wayned and gotten worse over the last week. Pt reports pain is 2/10 and is constant. Pt reports motrin

## 2022-11-12 ENCOUNTER — Ambulatory Visit: Payer: Self-pay

## 2022-11-15 ENCOUNTER — Ambulatory Visit (INDEPENDENT_AMBULATORY_CARE_PROVIDER_SITE_OTHER): Payer: BC Managed Care – PPO | Admitting: Family Medicine

## 2022-11-15 ENCOUNTER — Encounter: Payer: Self-pay | Admitting: Family Medicine

## 2022-11-15 VITALS — BP 117/77 | HR 81 | Temp 98.4°F | Resp 16 | Wt 226.0 lb

## 2022-11-15 DIAGNOSIS — R220 Localized swelling, mass and lump, head: Secondary | ICD-10-CM

## 2022-11-15 DIAGNOSIS — R52 Pain, unspecified: Secondary | ICD-10-CM | POA: Diagnosis not present

## 2022-11-15 NOTE — Progress Notes (Unsigned)
Patient c/o her silvery glands hurting x 1 week. Patient would like a referral to ENT provider.

## 2022-11-16 ENCOUNTER — Encounter: Payer: Self-pay | Admitting: Family Medicine

## 2022-11-16 NOTE — Progress Notes (Signed)
Established Patient Office Visit  Subjective    Patient ID: Hannah Weber, female    DOB: [birth year]  Age: 32 y.o. MRN: [id number]  CC: No chief complaint on file.   HPI Hannah Weber presents for follow up where she was seen in UC?ED for facial swelling and lymphadenopathy. She had initially been given steroids with no results. She is now on Augment with minimal results. She denies fever/chills or viral sx.    Outpatient Encounter Medications as of 11/15/2022  Medication Sig   acetaminophen (TYLENOL) 500 MG tablet Take 500 mg by mouth every 6 (six) hours as needed for moderate pain.   amoxicillin-clavulanate (AUGMENTIN) 875-125 MG tablet Take 1 tablet by mouth every 12 (twelve) hours.   cetirizine (ZYRTEC) 10 MG tablet Take 1 tablet (10 mg total) by mouth daily for 10 days.   fluticasone (FLONASE) 50 MCG/ACT nasal spray Place 1 spray into both nostrils daily for 3 days.   ibuprofen (ADVIL) 400 MG tablet SMARTSIG:1-2 Tablet(s) By Mouth 3-4 Times Daily PRN   Olopatadine HCl (PATADAY OP) Place 1 drop into both eyes daily as needed (itching).   PARoxetine (PAXIL) 20 MG tablet Take 1 tablet (20 mg total) by mouth daily.   SF 5000 PLUS 1.1 % CREA dental cream SMARTSIG:Sparingly By Mouth Daily   No facility-administered encounter medications on file as of 11/15/2022.    Past Medical History:  Diagnosis Date   Allergy    Hypertension     History reviewed. No pertinent surgical history.  Family History  Problem Relation Age of Onset   Diabetes Mother     Social History   Socioeconomic History   Marital status: Significant Other    Spouse name: Mertie Clause   Number of children: Not on file   Years of education: Not on file   Highest education level: Not on file  Occupational History   Occupation: customer service    Employer: [employer]  Tobacco Use   Smoking status: Former    Packs/day: 0.10    Years: 15.00    Total pack years: 1.50    Types: Cigarettes    Start date:  2006    Quit date: 10/02/2020    Years since quitting: 2.1   Smokeless tobacco: Never  Vaping Use   Vaping Use: Never used  Substance and Sexual Activity   Alcohol use: Yes    Comment: occ   Drug use: Yes    Types: Marijuana    Comment: 2 times a week   Sexual activity: Never    Birth control/protection: None  Other Topics Concern   Not on file  Social History Narrative   Not on file   Social Determinants of Health   Financial Resource Strain: Not on file  Food Insecurity: Not on file  Transportation Needs: Not on file  Physical Activity: Not on file  Stress: Not on file  Social Connections: Not on file  Intimate Partner Violence: Not on file    Review of Systems  All other systems reviewed and are negative.       Objective    BP 117/77   Pulse 81   Temp 98.4 F (36.9 C) (Oral)   Resp 16   Wt 226 lb (102.5 kg)   LMP 10/10/2022 (Approximate)   SpO2 97%   BMI 34.36 kg/m   Physical Exam Vitals and nursing note reviewed.  Constitutional:      General: She is not in acute distress. HENT:  Head: Normocephalic and atraumatic.     Salivary Glands: Right salivary gland is diffusely enlarged and tender.  Cardiovascular:     Rate and Rhythm: Normal rate and regular rhythm.  Pulmonary:     Effort: Pulmonary effort is normal.     Breath sounds: Normal breath sounds.  Abdominal:     Palpations: Abdomen is soft.     Tenderness: There is no abdominal tenderness.  Neurological:     General: No focal deficit present.     Mental Status: She is alert and oriented to person, place, and time.         Assessment & Plan:   1. Tenderness of salivary gland region Complete course of abx. Tylenol/nsaids prn. Referral to ENT for further eval/mgt - Ambulatory referral to ENT  2. Right facial swelling As above - Ambulatory referral to ENT    Return if symptoms worsen or fail to improve.   Becky Sax, MD

## 2022-11-20 ENCOUNTER — Encounter: Payer: Self-pay | Admitting: Family Medicine

## 2022-11-24 ENCOUNTER — Other Ambulatory Visit: Payer: Self-pay

## 2022-11-24 ENCOUNTER — Emergency Department (HOSPITAL_COMMUNITY)
Admission: EM | Admit: 2022-11-24 | Discharge: 2022-11-25 | Disposition: A | Discharge status: Home / Self Care | Payer: BC Managed Care – PPO | Attending: Emergency Medicine | Admitting: Emergency Medicine

## 2022-11-24 ENCOUNTER — Encounter (HOSPITAL_COMMUNITY): Payer: Self-pay

## 2022-11-24 DIAGNOSIS — Z20822 Contact with and (suspected) exposure to covid-19: Secondary | ICD-10-CM | POA: Insufficient documentation

## 2022-11-24 DIAGNOSIS — J029 Acute pharyngitis, unspecified: Secondary | ICD-10-CM | POA: Insufficient documentation

## 2022-11-24 LAB — GROUP A STREP BY PCR: Group A Strep by PCR: NOT DETECTED

## 2022-11-24 NOTE — ED Triage Notes (Signed)
Pt reports that her tonsils are swollen and she would like them checked.  Also reports right ear pain  No fever/chills/body aches.

## 2022-11-24 NOTE — ED Provider Notes (Signed)
[facility] EMERGENCY DEPARTMENT AT [facility] Provider Note   CSN: [id number] Arrival date & time: 11/24/22  2125     History  Chief Complaint  Patient presents with   Sore Throat    Hannah Weber is a 32 y.o. female.  HPI Reports has a painful area on the right side of her throat.  She indicates something just to the right of the laryngeal cartilage and will bit superior.  No fevers no chills.  She reports she was seen for this previously and diagnosed with a salivary gland inflammation.  She reports she took an antibiotic and steroids.  She reports there was not really much changes about the same.  She does have follow-up with ENT within about the next 2 weeks.  She was concerned however thought she needed a recheck sooner.    Home Medications Prior to Admission medications   Medication Sig Start Date End Date Taking? Authorizing Provider  traMADol (ULTRAM) 50 MG tablet 1 to 2 tablets every 6 hours as needed for pain. 11/25/22  Yes Arby Barrette, MD  acetaminophen (TYLENOL) 500 MG tablet Take 500 mg by mouth every 6 (six) hours as needed for moderate pain.    [provider]  amoxicillin-clavulanate (AUGMENTIN) 875-125 MG tablet Take 1 tablet by mouth every 12 (twelve) hours. 11/11/22   Gustavus Bryant, FNP  cetirizine (ZYRTEC) 10 MG tablet Take 1 tablet (10 mg total) by mouth daily for 10 days. 01/30/22 02/09/22  Gustavus Bryant, FNP  fluticasone (FLONASE) 50 MCG/ACT nasal spray Place 1 spray into both nostrils daily for 3 days. 01/30/22 02/02/22  Gustavus Bryant, FNP  ibuprofen (ADVIL) 400 MG tablet SMARTSIG:1-2 Tablet(s) By Mouth 3-4 Times Daily PRN 10/03/21   [provider]  Olopatadine HCl (PATADAY OP) Place 1 drop into both eyes daily as needed (itching).    [provider]  PARoxetine (PAXIL) 20 MG tablet Take 1 tablet (20 mg total) by mouth daily. 05/23/21   Georganna Skeans, MD  SF 5000 PLUS 1.1 % CREA dental cream SMARTSIG:Sparingly By Mouth  Daily 10/03/21   [provider]      Allergies    Apple and Other    Review of Systems   Review of Systems  Physical Exam Updated Vital Signs BP (!) 157/103 (BP Location: Right Arm)   Pulse 90   Temp 97.6 F (36.4 C) (Oral)   Resp 18   Ht 5\' 8"  (1.727 m)   Wt 104.3 kg   LMP 10/10/2022 (Approximate)   SpO2 100%   BMI 34.97 kg/m  Physical Exam Constitutional:      Comments: Alert nontoxic clinically well in appearance.  HENT:     Head: Normocephalic and atraumatic.     Right Ear: Tympanic membrane normal.     Left Ear: Tympanic membrane normal.     Mouth/Throat:     Mouth: Mucous membranes are moist.     Pharynx: Oropharynx is clear.     Comments: Posterior oropharynx widely patent.  No tonsillar erythema or exudate.  Dentition in good condition. Eyes:     Extraocular Movements: Extraocular movements intact.     Pupils: Pupils are equal, round, and reactive to light.  Neck:     Comments: No appreciable asymmetry of the soft tissues of the neck.  No mass or fullness.  No lymphadenopathy.  Soft tissues are supple. Pulmonary:     Effort: Pulmonary effort is normal.     Breath sounds: Normal  breath sounds.  Skin:    General: Skin is warm and dry.  Neurological:     General: No focal deficit present.     Mental Status: She is oriented to person, place, and time.  Psychiatric:        Mood and Affect: Mood normal.     ED Results / Procedures / Treatments   Labs (all labs ordered are listed, but only abnormal results are displayed) Labs Reviewed  GROUP A STREP BY PCR  RESP PANEL BY RT-PCR (RSV, FLU A&B, COVID)  [id number]    EKG None  Radiology No results found.  Procedures Procedures    Medications Ordered in ED Medications - No data to display  ED Course/ Medical Decision Making/ A&P                             Medical Decision Making Risk Prescription drug management.   Patient presents with a area of tenderness to the right lateral neck.   She notices it more with swallowing.  However no difficulty swallowing or breathing.  Physical exam does not reveal any appreciable mass fullness or lymphadenopathy.  Will proceed with rapid strep test.  Patient does have set up with ENT within about the next 2 weeks.  Clinically stable at this time with no evidence of any airway impingement.  Patient is otherwise normal in appearance.  No signs of any airway impingement.  I do not think that advanced imaging is indicated at this time.    Strep testing is negative.  Provide the patient with tramadol to take with Tylenol if needed for pain control.  Patient has ENT follow-up scheduled.  She should follow-up as planned.        Final Clinical Impression(s) / ED Diagnoses Final diagnoses:  Pharyngitis, unspecified etiology    Rx / DC Orders ED Discharge Orders          Ordered    traMADol (ULTRAM) 50 MG tablet        11/25/22 0009              Arby Barrette, MD 11/25/22 0010

## 2022-11-25 LAB — RESP PANEL BY RT-PCR (RSV, FLU A&B, COVID)  RVPGX2
Influenza A by PCR: NEGATIVE
Influenza B by PCR: NEGATIVE
Resp Syncytial Virus by PCR: NEGATIVE
SARS Coronavirus 2 by RT PCR: NEGATIVE

## 2022-11-25 MED ORDER — TRAMADOL HCL 50 MG PO TABS: ORAL_TABLET | ORAL | 0 refills | Status: DC

## 2022-11-25 NOTE — Discharge Instructions (Signed)
1.  You may take extra Tylenol with 1-2 tramadol tablets for pain as needed. 2.  Do salt water gargles to help with pain. 3.  Follow-up with the ear nose throat specialist plan.

## 2022-12-06 DIAGNOSIS — M542 Cervicalgia: Secondary | ICD-10-CM | POA: Insufficient documentation

## 2022-12-06 DIAGNOSIS — K1122 Acute recurrent sialoadenitis: Secondary | ICD-10-CM | POA: Insufficient documentation

## 2022-12-06 DIAGNOSIS — R220 Localized swelling, mass and lump, head: Secondary | ICD-10-CM | POA: Diagnosis not present

## 2022-12-07 ENCOUNTER — Other Ambulatory Visit: Payer: Self-pay | Admitting: Physician Assistant

## 2022-12-07 DIAGNOSIS — R220 Localized swelling, mass and lump, head: Secondary | ICD-10-CM

## 2022-12-07 DIAGNOSIS — M542 Cervicalgia: Secondary | ICD-10-CM

## 2022-12-14 ENCOUNTER — Ambulatory Visit
Admission: RE | Admit: 2022-12-14 | Discharge: 2022-12-14 | Disposition: A | Discharge status: Home / Self Care | Payer: BC Managed Care – PPO | Source: Ambulatory Visit | Attending: Physician Assistant | Admitting: Physician Assistant

## 2022-12-14 DIAGNOSIS — M542 Cervicalgia: Secondary | ICD-10-CM

## 2022-12-14 DIAGNOSIS — R220 Localized swelling, mass and lump, head: Secondary | ICD-10-CM

## 2022-12-14 DIAGNOSIS — R590 Localized enlarged lymph nodes: Secondary | ICD-10-CM | POA: Diagnosis not present

## 2022-12-14 MED ORDER — IOPAMIDOL (ISOVUE-300) INJECTION 61%
75.0000 mL | Freq: Once | INTRAVENOUS | Status: AC | PRN
  Administered 2022-12-14: 75 mL via INTRAVENOUS

## 2023-01-02 ENCOUNTER — Other Ambulatory Visit: Payer: BC Managed Care – PPO

## 2023-01-02 DIAGNOSIS — R599 Enlarged lymph nodes, unspecified: Secondary | ICD-10-CM | POA: Diagnosis not present

## 2023-01-02 DIAGNOSIS — K1122 Acute recurrent sialoadenitis: Secondary | ICD-10-CM | POA: Diagnosis not present

## 2023-02-15 ENCOUNTER — Ambulatory Visit (INDEPENDENT_AMBULATORY_CARE_PROVIDER_SITE_OTHER): Payer: BC Managed Care – PPO | Admitting: Family Medicine

## 2023-02-15 VITALS — BP 123/84 | HR 93 | Temp 98.7°F | Resp 16 | Wt 237.2 lb

## 2023-02-15 DIAGNOSIS — Z0289 Encounter for other administrative examinations: Secondary | ICD-10-CM | POA: Diagnosis not present

## 2023-02-15 DIAGNOSIS — F419 Anxiety disorder, unspecified: Secondary | ICD-10-CM | POA: Diagnosis not present

## 2023-02-15 DIAGNOSIS — F32A Depression, unspecified: Secondary | ICD-10-CM | POA: Diagnosis not present

## 2023-02-15 MED ORDER — ESCITALOPRAM OXALATE 5 MG PO TABS: 5.0000 mg | ORAL_TABLET | Freq: Every day | ORAL | 1 refills | Status: DC

## 2023-02-15 NOTE — Progress Notes (Signed)
Patienty is here for FMLA paper to be filled out by provider

## 2023-02-20 ENCOUNTER — Encounter: Payer: Self-pay | Admitting: Family Medicine

## 2023-02-20 ENCOUNTER — Telehealth: Payer: Self-pay | Admitting: *Deleted

## 2023-02-20 DIAGNOSIS — R590 Localized enlarged lymph nodes: Secondary | ICD-10-CM | POA: Diagnosis not present

## 2023-02-20 NOTE — Telephone Encounter (Signed)
Patient called back with faxed update.  Copied from CRM [id number]. Topic: General - Other >> Feb 19, 2023  4:10 PM Hannah Weber wrote: Patient is calling in regards to paper work that was suppose to be faxed last week (May 16) ... Can you please advise the patient and let her know if the documents were sent.

## 2023-02-20 NOTE — Progress Notes (Signed)
Established Patient Office Visit  Subjective    Patient ID: Hannah Weber, female    DOB: Feb 12, 1991  Age: 32 y.o. MRN: [id number]  CC: No chief complaint on file.   HPI Hannah Weber presents for follow up of anxiety/depression and for completion of FMLA form. Patient is now under care of a counselor.    Outpatient Encounter Medications as of 02/15/2023  Medication Sig   escitalopram (LEXAPRO) 5 MG tablet Take 1 tablet (5 mg total) by mouth daily.   acetaminophen (TYLENOL) 500 MG tablet Take 500 mg by mouth every 6 (six) hours as needed for moderate pain.   amoxicillin-clavulanate (AUGMENTIN) 875-125 MG tablet Take 1 tablet by mouth every 12 (twelve) hours.   cetirizine (ZYRTEC) 10 MG tablet Take 1 tablet (10 mg total) by mouth daily for 10 days.   fluticasone (FLONASE) 50 MCG/ACT nasal spray Place 1 spray into both nostrils daily for 3 days.   ibuprofen (ADVIL) 400 MG tablet SMARTSIG:1-2 Tablet(s) By Mouth 3-4 Times Daily PRN   Olopatadine HCl (PATADAY OP) Place 1 drop into both eyes daily as needed (itching).   SF 5000 PLUS 1.1 % CREA dental cream SMARTSIG:Sparingly By Mouth Daily   traMADol (ULTRAM) 50 MG tablet 1 to 2 tablets every 6 hours as needed for pain.   [DISCONTINUED] PARoxetine (PAXIL) 20 MG tablet Take 1 tablet (20 mg total) by mouth daily.   No facility-administered encounter medications on file as of 02/15/2023.    Past Medical History:  Diagnosis Date   Allergy    Hypertension     No past surgical history on file.  Family History  Problem Relation Age of Onset   Diabetes Mother     Social History   Socioeconomic History   Marital status: Significant Other    Spouse name: Karn Cassis   Number of children: Not on file   Years of education: Not on file   Highest education level: Associate degree: occupational, Scientist, product/process development, or vocational program  Occupational History   Occupation: customer service    Employer: [employer]  Tobacco Use   Smoking status:  Former    Packs/day: 0.10    Years: 15.00    Additional pack years: 0.00    Total pack years: 1.50    Types: Cigarettes    Start date: 2006    Quit date: 10/02/2020    Years since quitting: 2.3   Smokeless tobacco: Never  Vaping Use   Vaping Use: Never used  Substance and Sexual Activity   Alcohol use: Yes    Comment: occ   Drug use: Yes    Types: Marijuana    Comment: 2 times a week   Sexual activity: Never    Birth control/protection: None  Other Topics Concern   Not on file  Social History Narrative   Not on file   Social Determinants of Health   Financial Resource Strain: Low Risk  (02/15/2023)   Overall Financial Resource Strain (CARDIA)    Difficulty of Paying Living Expenses: Not very hard  Food Insecurity: Food Insecurity Present (02/15/2023)   Hunger Vital Sign    Worried About Running Out of Food in the Last Year: Sometimes true    Ran Out of Food in the Last Year: Sometimes true  Transportation Needs: No Transportation Needs (02/15/2023)   PRAPARE - Administrator, Civil Service (Medical): No    Lack of Transportation (Non-Medical): No  Physical Activity: Insufficiently Active (02/15/2023)   Exercise Vital Sign  Days of Exercise per Week: 3 days    Minutes of Exercise per Session: 30 min  Stress: Stress Concern Present (02/15/2023)   [employer] of Occupational Health - Occupational Stress Questionnaire    Feeling of Stress : Rather much  Social Connections: Unknown (02/15/2023)   Social Connection and Isolation Panel [NHANES]    Frequency of Communication with Friends and Family: Not on file    Frequency of Social Gatherings with Friends and Family: Not on file    Attends Religious Services: Not on file    Active Member of Clubs or Organizations: No    Attends Banker Meetings: Not on file    Marital Status: Not on file  Intimate Partner Violence: Not on file    Review of Systems  Psychiatric/Behavioral:  Positive for  depression. Negative for suicidal ideas. The patient is nervous/anxious. The patient does not have insomnia.   All other systems reviewed and are negative.       Objective    BP 123/84   Pulse 93   Temp 98.7 F (37.1 C) (Oral)   Resp 16   Wt 237 lb 3.2 oz (107.6 kg)   SpO2 96%   BMI 36.07 kg/m   Physical Exam Vitals and nursing note reviewed.  Constitutional:      General: She is not in acute distress.    Appearance: She is obese.  Cardiovascular:     Rate and Rhythm: Normal rate and regular rhythm.  Pulmonary:     Effort: Pulmonary effort is normal.     Breath sounds: Normal breath sounds.  Neurological:     General: No focal deficit present.     Mental Status: She is alert and oriented to person, place, and time.  Psychiatric:        Mood and Affect: Mood normal. Affect is blunt.        Speech: Speech normal.        Behavior: Behavior normal.         Assessment & Plan:   1. Anxiety and depression Continue with counselor. Lexapro 5 mg prescribed.   2. Encounter for completion of form with patient FMLA form completed.     Return in about 3 months (around 05/18/2023) for follow up.   Tommie Raymond, MD

## 2023-02-28 ENCOUNTER — Other Ambulatory Visit (HOSPITAL_COMMUNITY): Payer: Self-pay | Admitting: Otolaryngology

## 2023-02-28 DIAGNOSIS — R590 Localized enlarged lymph nodes: Secondary | ICD-10-CM

## 2023-02-28 DIAGNOSIS — R599 Enlarged lymph nodes, unspecified: Secondary | ICD-10-CM

## 2023-02-28 NOTE — Progress Notes (Signed)
Hannah Mor, DO  Leodis Rains D OK for US guided biopsy of right submandibular node.  CT 12/14/22  Measures 2.0cm.  if smaller, might defer and watch, given the interval.  [address]

## 2023-03-12 ENCOUNTER — Ambulatory Visit (HOSPITAL_COMMUNITY): Payer: BC Managed Care – PPO

## 2023-03-12 ENCOUNTER — Encounter (HOSPITAL_COMMUNITY): Payer: Self-pay

## 2023-03-27 ENCOUNTER — Ambulatory Visit: Payer: BC Managed Care – PPO | Admitting: Family Medicine

## 2023-04-17 ENCOUNTER — Ambulatory Visit: Payer: BC Managed Care – PPO | Admitting: Family Medicine

## 2023-04-19 ENCOUNTER — Ambulatory Visit: Payer: BC Managed Care – PPO | Admitting: Family Medicine

## 2023-05-17 ENCOUNTER — Ambulatory Visit (INDEPENDENT_AMBULATORY_CARE_PROVIDER_SITE_OTHER): Payer: BC Managed Care – PPO | Admitting: Family Medicine

## 2023-05-17 VITALS — BP 120/83 | HR 92 | Temp 98.1°F | Resp 16 | Wt 239.0 lb

## 2023-05-17 DIAGNOSIS — F32A Depression, unspecified: Secondary | ICD-10-CM | POA: Diagnosis not present

## 2023-05-17 DIAGNOSIS — F419 Anxiety disorder, unspecified: Secondary | ICD-10-CM

## 2023-05-17 MED ORDER — ESCITALOPRAM OXALATE 5 MG PO TABS: 5.0000 mg | ORAL_TABLET | Freq: Every day | ORAL | 1 refills | Status: DC

## 2023-05-17 NOTE — Progress Notes (Signed)
Patient is here for 1 month f/u medication refill

## 2023-05-21 ENCOUNTER — Encounter: Payer: Self-pay | Admitting: Family Medicine

## 2023-05-21 NOTE — Progress Notes (Signed)
Established Patient Office Visit  Subjective    Patient ID: Hannah Weber, female    DOB: 1991-09-02  Age: 32 y.o. MRN: [id number]  CC:  Chief Complaint  Patient presents with   Follow-up    HPI TONIAH WYNKOOP presents to follow up on anxiety and depression. She reports that she is improved with present management.    Outpatient Encounter Medications as of 05/17/2023  Medication Sig   acetaminophen (TYLENOL) 500 MG tablet Take 500 mg by mouth every 6 (six) hours as needed for moderate pain.   amoxicillin-clavulanate (AUGMENTIN) 875-125 MG tablet Take 1 tablet by mouth every 12 (twelve) hours.   ibuprofen (ADVIL) 400 MG tablet SMARTSIG:1-2 Tablet(s) By Mouth 3-4 Times Daily PRN   Olopatadine HCl (PATADAY OP) Place 1 drop into both eyes daily as needed (itching).   SF 5000 PLUS 1.1 % CREA dental cream SMARTSIG:Sparingly By Mouth Daily   traMADol (ULTRAM) 50 MG tablet 1 to 2 tablets every 6 hours as needed for pain.   [DISCONTINUED] escitalopram (LEXAPRO) 5 MG tablet Take 1 tablet (5 mg total) by mouth daily.   cetirizine (ZYRTEC) 10 MG tablet Take 1 tablet (10 mg total) by mouth daily for 10 days.   escitalopram (LEXAPRO) 5 MG tablet Take 1 tablet (5 mg total) by mouth daily.   fluticasone (FLONASE) 50 MCG/ACT nasal spray Place 1 spray into both nostrils daily for 3 days.   No facility-administered encounter medications on file as of 05/17/2023.    Past Medical History:  Diagnosis Date   Allergy    Hypertension     No past surgical history on file.  Family History  Problem Relation Age of Onset   Diabetes Mother     Social History   Socioeconomic History   Marital status: Significant Other    Spouse name: Karn Cassis   Number of children: Not on file   Years of education: Not on file   Highest education level: Associate degree: occupational, Scientist, product/process development, or vocational program  Occupational History   Occupation: customer service    Employer: [employer]  Tobacco Use    Smoking status: Former    Current packs/day: 0.00    Average packs/day: 0.1 packs/day for 16.0 years (1.6 ttl pk-yrs)    Types: Cigarettes    Start date: 2006    Quit date: 10/02/2020    Years since quitting: 2.6   Smokeless tobacco: Never  Vaping Use   Vaping status: Never Used  Substance and Sexual Activity   Alcohol use: Yes    Comment: occ   Drug use: Yes    Types: Marijuana    Comment: 2 times a week   Sexual activity: Never    Birth control/protection: None  Other Topics Concern   Not on file  Social History Narrative   Not on file   Social Determinants of Health   Financial Resource Strain: Low Risk  (02/15/2023)   Overall Financial Resource Strain (CARDIA)    Difficulty of Paying Living Expenses: Not very hard  Food Insecurity: Food Insecurity Present (02/15/2023)   Hunger Vital Sign    Worried About Running Out of Food in the Last Year: Sometimes true    Ran Out of Food in the Last Year: Sometimes true  Transportation Needs: No Transportation Needs (02/15/2023)   PRAPARE - Administrator, Civil Service (Medical): No    Lack of Transportation (Non-Medical): No  Physical Activity: Insufficiently Active (02/15/2023)   Exercise Vital Sign  Days of Exercise per Week: 3 days    Minutes of Exercise per Session: 30 min  Stress: Stress Concern Present (02/15/2023)   [employer] of Occupational Health - Occupational Stress Questionnaire    Feeling of Stress : Rather much  Social Connections: Unknown (02/15/2023)   Social Connection and Isolation Panel [NHANES]    Frequency of Communication with Friends and Family: Not on file    Frequency of Social Gatherings with Friends and Family: Not on file    Attends Religious Services: Not on file    Active Member of Clubs or Organizations: No    Attends Banker Meetings: Not on file    Marital Status: Not on file  Intimate Partner Violence: Not on file    Review of Systems  Psychiatric/Behavioral:   Positive for depression. Negative for suicidal ideas. The patient is nervous/anxious. The patient does not have insomnia.   All other systems reviewed and are negative.       Objective    BP 120/83   Pulse 92   Temp 98.1 F (36.7 C) (Oral)   Resp 16   Wt 239 lb (108.4 kg)   SpO2 96%   BMI 36.34 kg/m   Physical Exam Vitals and nursing note reviewed.  Constitutional:      General: She is not in acute distress.    Appearance: She is obese.  Cardiovascular:     Rate and Rhythm: Normal rate and regular rhythm.  Pulmonary:     Effort: Pulmonary effort is normal.     Breath sounds: Normal breath sounds.  Neurological:     General: No focal deficit present.     Mental Status: She is alert and oriented to person, place, and time.  Psychiatric:        Mood and Affect: Mood normal. Affect is blunt.        Speech: Speech normal.        Behavior: Behavior normal.         Assessment & Plan:   1. Anxiety and depression Improved. Continue with [facility] for counseling. Meds refilled.     Return in about 6 months (around 11/17/2023) for follow up.   Tommie Raymond, MD

## 2023-06-21 NOTE — Progress Notes (Unsigned)
Serena Colonel, MD  Leodis Rains D I can re-order if she decides to reschedule.

## 2023-06-29 DIAGNOSIS — L731 Pseudofolliculitis barbae: Secondary | ICD-10-CM | POA: Diagnosis not present

## 2023-06-29 DIAGNOSIS — L680 Hirsutism: Secondary | ICD-10-CM | POA: Diagnosis not present

## 2023-06-29 DIAGNOSIS — L700 Acne vulgaris: Secondary | ICD-10-CM | POA: Diagnosis not present

## 2023-08-02 ENCOUNTER — Telehealth: Payer: Self-pay | Admitting: Family Medicine

## 2023-08-02 NOTE — Telephone Encounter (Signed)
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

## 2023-08-03 NOTE — Telephone Encounter (Signed)
Noted  

## 2023-08-03 NOTE — Telephone Encounter (Signed)
             Patient dropped off document FMLA, to be filled out by provider. Patient requested to send it back via Fax within 7-days. Document is located in providers tray at front office.Please advise at Mobile [phone] (mobile)

## 2023-08-06 NOTE — Telephone Encounter (Signed)
Paper at nurse desk . Please schedule an appt

## 2023-08-06 NOTE — Telephone Encounter (Signed)
Patient has appointment scheduled.

## 2023-08-09 ENCOUNTER — Ambulatory Visit: Payer: BC Managed Care – PPO | Admitting: Family Medicine

## 2023-08-22 ENCOUNTER — Encounter: Payer: Self-pay | Admitting: Family Medicine

## 2023-08-22 ENCOUNTER — Ambulatory Visit (INDEPENDENT_AMBULATORY_CARE_PROVIDER_SITE_OTHER): Payer: BC Managed Care – PPO | Admitting: Family Medicine

## 2023-08-22 ENCOUNTER — Telehealth: Payer: Self-pay | Admitting: Family Medicine

## 2023-08-22 VITALS — BP 125/86 | HR 95 | Temp 98.7°F | Resp 18 | Ht 68.0 in | Wt 239.2 lb

## 2023-08-22 DIAGNOSIS — Z0289 Encounter for other administrative examinations: Secondary | ICD-10-CM | POA: Diagnosis not present

## 2023-08-22 DIAGNOSIS — F419 Anxiety disorder, unspecified: Secondary | ICD-10-CM | POA: Diagnosis not present

## 2023-08-22 DIAGNOSIS — F32A Depression, unspecified: Secondary | ICD-10-CM

## 2023-08-22 MED ORDER — ESCITALOPRAM OXALATE 10 MG PO TABS: 10.0000 mg | ORAL_TABLET | Freq: Every day | ORAL | 1 refills | Status: DC

## 2023-08-22 NOTE — Telephone Encounter (Signed)
Copied from CRM [id number]. Topic: General - Other >> Aug 22, 2023  1:06 PM Franchot Heidelberg wrote: Reason for CRM: Called to provide fax number for [employer]  Fax: [phone]

## 2023-08-22 NOTE — Telephone Encounter (Signed)
I faxed paper to insurance company

## 2023-08-23 ENCOUNTER — Encounter: Payer: Self-pay | Admitting: Family Medicine

## 2023-08-24 ENCOUNTER — Encounter: Payer: Self-pay | Admitting: Family Medicine

## 2023-08-24 NOTE — Progress Notes (Signed)
Established Patient Office Visit  Subjective    Patient ID: Hannah Weber, female    DOB: Jun 13, 1991  Age: 32 y.o. MRN: [id number]  CC:  Chief Complaint  Patient presents with   Follow-up    FMLA paperwork    HPI Hannah Weber presents for follow up of anxiety and depression. Patient says that she is changing counselors 2/2 insurance issues and continues to take lexapro.She reports that she has increased stress because she is now starting school. Se believes that she may need meds for ADHD. She also would like extra days in her FMLA to help her manage the increased stress.   Outpatient Encounter Medications as of 08/22/2023  Medication Sig   acetaminophen (TYLENOL) 500 MG tablet Take 500 mg by mouth every 6 (six) hours as needed for moderate pain.   amoxicillin-clavulanate (AUGMENTIN) 875-125 MG tablet Take 1 tablet by mouth every 12 (twelve) hours.   escitalopram (LEXAPRO) 10 MG tablet Take 1 tablet (10 mg total) by mouth daily.   escitalopram (LEXAPRO) 5 MG tablet Take 1 tablet (5 mg total) by mouth daily.   ibuprofen (ADVIL) 400 MG tablet SMARTSIG:1-2 Tablet(s) By Mouth 3-4 Times Daily PRN   Olopatadine HCl (PATADAY OP) Place 1 drop into both eyes daily as needed (itching).   SF 5000 PLUS 1.1 % CREA dental cream SMARTSIG:Sparingly By Mouth Daily   traMADol (ULTRAM) 50 MG tablet 1 to 2 tablets every 6 hours as needed for pain.   cetirizine (ZYRTEC) 10 MG tablet Take 1 tablet (10 mg total) by mouth daily for 10 days.   fluticasone (FLONASE) 50 MCG/ACT nasal spray Place 1 spray into both nostrils daily for 3 days.   No facility-administered encounter medications on file as of 08/22/2023.    Past Medical History:  Diagnosis Date   Allergy    Hypertension     History reviewed. No pertinent surgical history.  Family History  Problem Relation Age of Onset   Diabetes Mother     Social History   Socioeconomic History   Marital status: Significant Other    Spouse  name: Karn Cassis   Number of children: Not on file   Years of education: Not on file   Highest education level: Associate degree: occupational, Scientist, product/process development, or vocational program  Occupational History   Occupation: customer service    Employer: [employer]  Tobacco Use   Smoking status: Former    Current packs/day: 0.00    Average packs/day: 0.1 packs/day for 16.0 years (1.6 ttl pk-yrs)    Types: Cigarettes    Start date: 2006    Quit date: 10/02/2020    Years since quitting: 2.8   Smokeless tobacco: Never  Vaping Use   Vaping status: Never Used  Substance and Sexual Activity   Alcohol use: Yes    Comment: occ   Drug use: Yes    Types: Marijuana    Comment: 2 times a week   Sexual activity: Never    Birth control/protection: None  Other Topics Concern   Not on file  Social History Narrative   Not on file   Social Determinants of Health   Financial Resource Strain: Low Risk  (02/15/2023)   Overall Financial Resource Strain (CARDIA)    Difficulty of Paying Living Expenses: Not very hard  Food Insecurity: Food Insecurity Present (02/15/2023)   Hunger Vital Sign    Worried About Running Out of Food in the Last Year: Sometimes true    Ran Out of Food in  the Last Year: Sometimes true  Transportation Needs: No Transportation Needs (02/15/2023)   PRAPARE - Administrator, Civil Service (Medical): No    Lack of Transportation (Non-Medical): No  Physical Activity: Insufficiently Active (02/15/2023)   Exercise Vital Sign    Days of Exercise per Week: 3 days    Minutes of Exercise per Session: 30 min  Stress: Stress Concern Present (02/15/2023)   [employer] of Occupational Health - Occupational Stress Questionnaire    Feeling of Stress : Rather much  Social Connections: Unknown (02/15/2023)   Social Connection and Isolation Panel [NHANES]    Frequency of Communication with Friends and Family: Not on file    Frequency of Social Gatherings with Friends and Family: Not on file     Attends Religious Services: Not on file    Active Member of Clubs or Organizations: No    Attends Banker Meetings: Not on file    Marital Status: Not on file  Intimate Partner Violence: Not on file    Review of Systems  Psychiatric/Behavioral:  Positive for depression. Negative for suicidal ideas. The patient is nervous/anxious.   All other systems reviewed and are negative.       Objective    BP 125/86 (BP Location: Right Arm, Patient Position: Sitting, Cuff Size: Normal)   Pulse 95   Temp 98.7 F (37.1 C) (Oral)   Resp 18   Ht 5\' 8"  (1.727 m)   Wt 239 lb 3.2 oz (108.5 kg)   SpO2 98%   BMI 36.37 kg/m   Physical Exam Vitals and nursing note reviewed.  Constitutional:      General: She is not in acute distress.    Appearance: She is obese.  Cardiovascular:     Rate and Rhythm: Normal rate and regular rhythm.  Pulmonary:     Effort: Pulmonary effort is normal.     Breath sounds: Normal breath sounds.  Neurological:     General: No focal deficit present.     Mental Status: She is alert and oriented to person, place, and time.  Psychiatric:        Mood and Affect: Mood normal. Affect is blunt.        Speech: Speech normal.        Behavior: Behavior normal.         Assessment & Plan:   1. Anxiety and depression Patient to continue meds and counseling. Should f/u with MH for possible ADHD testing.   2. Encounter for completion of form with patient Form completed and given 3 days/mo for FMLA for the next 6 months.    No follow-ups on file.   Tommie Raymond, MD

## 2023-08-27 ENCOUNTER — Telehealth: Payer: Self-pay | Admitting: Emergency Medicine

## 2023-08-27 NOTE — Telephone Encounter (Signed)
I called patient and made her aware that I faxed the paperwork the same day she sent me the fax number.

## 2023-11-13 ENCOUNTER — Emergency Department (HOSPITAL_BASED_OUTPATIENT_CLINIC_OR_DEPARTMENT_OTHER)
Admission: EM | Admit: 2023-11-13 | Discharge: 2023-11-13 | Disposition: A | Discharge status: Home / Self Care | Payer: BC Managed Care – PPO | Attending: Emergency Medicine | Admitting: Emergency Medicine

## 2023-11-13 ENCOUNTER — Encounter (HOSPITAL_BASED_OUTPATIENT_CLINIC_OR_DEPARTMENT_OTHER): Payer: Self-pay | Admitting: Emergency Medicine

## 2023-11-13 ENCOUNTER — Other Ambulatory Visit: Payer: Self-pay

## 2023-11-13 DIAGNOSIS — Z1152 Encounter for screening for COVID-19: Secondary | ICD-10-CM | POA: Insufficient documentation

## 2023-11-13 DIAGNOSIS — H9201 Otalgia, right ear: Secondary | ICD-10-CM | POA: Insufficient documentation

## 2023-11-13 LAB — RESP PANEL BY RT-PCR (RSV, FLU A&B, COVID)  RVPGX2
Influenza A by PCR: NEGATIVE
Influenza B by PCR: NEGATIVE
Resp Syncytial Virus by PCR: NEGATIVE
SARS Coronavirus 2 by RT PCR: NEGATIVE

## 2023-11-13 LAB — GROUP A STREP BY PCR: Group A Strep by PCR: NOT DETECTED

## 2023-11-13 NOTE — ED Notes (Signed)
AVS provided by edp was reviewed with pt. Pt verbalized understanding with no additional questions at this time.

## 2023-11-13 NOTE — ED Provider Notes (Signed)
[place] EMERGENCY DEPARTMENT AT [facility] Provider Note   CSN: [id number] Arrival date & time: 11/13/23  1653     History  Chief Complaint  Patient presents with   Otalgia    Hannah Weber is a 33 y.o. female.  Patient presents with right ear pain onset several days ago, along with mild sore throat. Denies fever, difficulty swallowing, hearing loss. She endorses constantly wearing an ear bud in the right ear.   Otalgia Location:  Right Behind ear:  No abnormality Quality:  Pressure Severity:  Moderate Onset quality:  Gradual Duration:  3 days Timing:  Intermittent Progression:  Waxing and waning Chronicity:  New Context: not direct blow, not elevation change, not foreign body in ear and not recent URI   Ineffective treatments:  None tried Associated symptoms: sore throat   Associated symptoms: no abdominal pain, no congestion, no cough, no fever and no rhinorrhea        Home Medications Prior to Admission medications   Medication Sig Start Date End Date Taking? Authorizing Provider  acetaminophen (TYLENOL) 500 MG tablet Take 500 mg by mouth every 6 (six) hours as needed for moderate pain.    [provider]  amoxicillin-clavulanate (AUGMENTIN) 875-125 MG tablet Take 1 tablet by mouth every 12 (twelve) hours. 11/11/22   Gustavus Bryant, FNP  cetirizine (ZYRTEC) 10 MG tablet Take 1 tablet (10 mg total) by mouth daily for 10 days. 01/30/22 02/09/22  Gustavus Bryant, FNP  escitalopram (LEXAPRO) 10 MG tablet Take 1 tablet (10 mg total) by mouth daily. 08/22/23   Georganna Skeans, MD  escitalopram (LEXAPRO) 5 MG tablet Take 1 tablet (5 mg total) by mouth daily. 05/17/23   Georganna Skeans, MD  fluticasone [facility]) 50 MCG/ACT nasal spray Place 1 spray into both nostrils daily for 3 days. 01/30/22 02/02/22  Gustavus Bryant, FNP  ibuprofen (ADVIL) 400 MG tablet SMARTSIG:1-2 Tablet(s) By Mouth 3-4 Times Daily PRN 10/03/21   [provider]  Olopatadine HCl  (PATADAY OP) Place 1 drop into both eyes daily as needed (itching).    [provider]  SF 5000 PLUS 1.1 % CREA dental cream SMARTSIG:Sparingly By Mouth Daily 10/03/21   [provider]  traMADol (ULTRAM) 50 MG tablet 1 to 2 tablets every 6 hours as needed for pain. 11/25/22   Arby Barrette, MD      Allergies    Apple and Other    Review of Systems   Review of Systems  Constitutional:  Negative for fever.  HENT:  Positive for ear pain and sore throat. Negative for congestion and rhinorrhea.   Respiratory:  Negative for cough.   Gastrointestinal:  Negative for abdominal pain.  All other systems reviewed and are negative.   Physical Exam Updated Vital Signs BP (!) 159/104 (BP Location: Right Arm)   Pulse 99   Temp 98.4 F (36.9 C)   Resp 18   LMP 10/31/2023   SpO2 100%  Physical Exam Constitutional:      Appearance: Normal appearance.  HENT:     Right Ear: Tympanic membrane and external ear normal.     Left Ear: Tympanic membrane and external ear normal.     Nose: Nose normal.     Mouth/Throat:     Mouth: Mucous membranes are moist.     Pharynx: No oropharyngeal exudate or posterior oropharyngeal erythema.  Eyes:     Conjunctiva/sclera: Conjunctivae normal.  Cardiovascular:     Rate and Rhythm: Normal rate  and regular rhythm.  Pulmonary:     Effort: Pulmonary effort is normal.     Breath sounds: Normal breath sounds.  Musculoskeletal:        General: Normal range of motion.  Skin:    General: Skin is warm and dry.  Neurological:     Mental Status: She is alert and oriented to person, place, and time.  Psychiatric:        Mood and Affect: Mood normal.        Behavior: Behavior normal.     ED Results / Procedures / Treatments   Labs (all labs ordered are listed, but only abnormal results are displayed) Labs Reviewed  RESP PANEL BY RT-PCR (RSV, FLU A&B, COVID)  RVPGX2  GROUP A STREP BY PCR    EKG None  Radiology No results  found.  Procedures Procedures    Medications Ordered in ED Medications - No data to display  ED Course/ Medical Decision Making/ A&P                                 Medical Decision Making  Pt presenting with right otalgia. Pt afebrile in NAD. Exam not concerning for mastoiditis, otitis media, otitis externae. Likely irritation due to ear bud usage.  Advised follow up with PCP in 2-3 days if no improvement.  Return precautions discussed. Pt appears safe for discharge.         Final Clinical Impression(s) / ED Diagnoses Final diagnoses:  Acute otalgia, right    Rx / DC Orders ED Discharge Orders     None         Felicie Morn, NP 11/13/23 2119    Vanetta Mulders, MD 11/15/23 1341

## 2023-11-13 NOTE — Discharge Instructions (Signed)
Tylenol and/or ibuprofen for discomfort. Please refer to the attached instructions. Follow-up with your primary care provider if no improvement.

## 2023-11-13 NOTE — ED Triage Notes (Signed)
Right ear pain, started a few days ago. Sore throat

## 2023-11-19 ENCOUNTER — Ambulatory Visit: Payer: BC Managed Care – PPO | Admitting: Family Medicine

## 2023-12-07 ENCOUNTER — Ambulatory Visit
Admission: EM | Admit: 2023-12-07 | Discharge: 2023-12-07 | Disposition: A | Discharge status: Home / Self Care | Attending: Internal Medicine | Admitting: Internal Medicine

## 2023-12-07 DIAGNOSIS — Z113 Encounter for screening for infections with a predominantly sexual mode of transmission: Secondary | ICD-10-CM | POA: Insufficient documentation

## 2023-12-07 DIAGNOSIS — R300 Dysuria: Secondary | ICD-10-CM | POA: Insufficient documentation

## 2023-12-07 LAB — POCT URINALYSIS DIP (MANUAL ENTRY)
Bilirubin, UA: NEGATIVE
Glucose, UA: NEGATIVE mg/dL
Ketones, POC UA: NEGATIVE mg/dL
Leukocytes, UA: NEGATIVE
Nitrite, UA: NEGATIVE
Protein Ur, POC: 30 mg/dL — AB
Spec Grav, UA: 1.025
Urobilinogen, UA: 0.2 U/dL
pH, UA: 6.5

## 2023-12-07 NOTE — Discharge Instructions (Signed)
 Urine was clear.  Vaginal swab is pending.

## 2023-12-07 NOTE — ED Provider Notes (Signed)
 [name]    CSN: [id number] Arrival date & time: 12/07/23  1459      History   Chief Complaint Chief Complaint  Patient presents with   UTI   Abdominal Pain    HPI Hannah Weber is a 33 y.o. female.   Patient presents with concerns of urinary tract infection given that she developed urinary burning yesterday.  Denies urinary frequency, hematuria, vaginal discharge.  Reports she is currently on her menstrual cycle and has been having monthly menstrual cycles.  Patient denies exposure to STD. Reports lower abdominal cramping but patient thinks this is due to current menstrual cycle.    Abdominal Pain   Past Medical History:  Diagnosis Date   Allergy    Hypertension     Patient Active Problem List   Diagnosis Date Noted   Cervical lymphadenopathy 02/20/2023   Enlarged lymph node 01/02/2023   Acute recurrent sialoadenitis 12/06/2022   Facial swelling 12/06/2022   Neck pain 12/06/2022   Otalgia, left 03/10/2021   Temporomandibular joint dysfunction 03/10/2021   Tinnitus of both ears 03/10/2021   Hyperprolactinemia (HCC) 06/18/2020   Ketonuria 01/12/2018   Achilles tendinitis of both lower extremities 11/09/2017   Smoker unmotivated to quit 09/07/2015    No past surgical history on file.  OB History   No obstetric history on file.      Home Medications    Prior to Admission medications   Medication Sig Start Date End Date Taking? Authorizing Provider  acetaminophen (TYLENOL) 500 MG tablet Take 500 mg by mouth every 6 (six) hours as needed for moderate pain.    [provider]  amoxicillin-clavulanate (AUGMENTIN) 875-125 MG tablet Take 1 tablet by mouth every 12 (twelve) hours. 11/11/22   Gustavus Bryant, FNP  cetirizine (ZYRTEC) 10 MG tablet Take 1 tablet (10 mg total) by mouth daily for 10 days. 01/30/22 02/09/22  Gustavus Bryant, FNP  clindamycin (CLEOCIN T) 1 % external solution Apply topically.    [provider]   escitalopram (LEXAPRO) 10 MG tablet Take 1 tablet (10 mg total) by mouth daily. 08/22/23   Georganna Skeans, MD  escitalopram (LEXAPRO) 5 MG tablet Take 1 tablet (5 mg total) by mouth daily. 05/17/23   Georganna Skeans, MD  fluticasone [facility]) 50 MCG/ACT nasal spray Place 1 spray into both nostrils daily for 3 days. 01/30/22 02/02/22  Gustavus Bryant, FNP  ibuprofen (ADVIL) 400 MG tablet SMARTSIG:1-2 Tablet(s) By Mouth 3-4 Times Daily PRN 10/03/21   [provider]  Olopatadine HCl (PATADAY OP) Place 1 drop into both eyes daily as needed (itching).    [provider]  SF 5000 PLUS 1.1 % CREA dental cream SMARTSIG:Sparingly By Mouth Daily 10/03/21   [provider]  traMADol (ULTRAM) 50 MG tablet 1 to 2 tablets every 6 hours as needed for pain. 11/25/22   Arby Barrette, MD  tretinoin (RETIN-A) 0.025 % cream Apply topically.    [provider]    Family History Family History  Problem Relation Age of Onset   Diabetes Mother     Social History Social History   Tobacco Use   Smoking status: Former    Current packs/day: 0.00    Average packs/day: 0.1 packs/day for 16.0 years (1.6 ttl pk-yrs)    Types: Cigarettes    Start date: 2006    Quit date: 10/02/2020    Years since quitting: 3.1   Smokeless tobacco: Never  Vaping Use   Vaping status: Never  Used  Substance Use Topics   Alcohol use: Yes    Comment: occ   Drug use: Yes    Types: Marijuana    Comment: 2 times a week     Allergies   Apple and Other   Review of Systems Review of Systems Per HPI  Physical Exam Triage Vital Signs ED Triage Vitals  Encounter Vitals Group     BP 12/07/23 1609 134/86     Systolic BP Percentile --      Diastolic BP Percentile --      Pulse Rate 12/07/23 1609 85     Resp 12/07/23 1609 16     Temp 12/07/23 1609 98.6 F (37 C)     Temp Source 12/07/23 1609 Oral     SpO2 12/07/23 1609 98 %     Weight --      Height --      Head Circumference --      Peak Flow  --      Pain Score 12/07/23 1607 3     Pain Loc --      Pain Education --      Exclude from Growth Chart --    No data found.  Updated Vital Signs BP 134/86 (BP Location: Right Arm)   Pulse 85   Temp 98.6 F (37 C) (Oral)   Resp 16   LMP 12/07/2023 (Exact Date)   SpO2 98%   Visual Acuity Right Eye Distance:   Left Eye Distance:   Bilateral Distance:    Right Eye Near:   Left Eye Near:    Bilateral Near:     Physical Exam Constitutional:      General: She is not in acute distress.    Appearance: Normal appearance. She is not toxic-appearing or diaphoretic.  HENT:     Head: Normocephalic and atraumatic.  Eyes:     Extraocular Movements: Extraocular movements intact.     Conjunctiva/sclera: Conjunctivae normal.  Pulmonary:     Effort: Pulmonary effort is normal.  Genitourinary:    Comments: Deferred with shared decision making. Self swab performed.  Neurological:     General: No focal deficit present.     Mental Status: She is alert and oriented to person, place, and time. Mental status is at baseline.  Psychiatric:        Mood and Affect: Mood normal.        Behavior: Behavior normal.        Thought Content: Thought content normal.        Judgment: Judgment normal.      UC Treatments / Results  Labs (all labs ordered are listed, but only abnormal results are displayed) Labs Reviewed  POCT URINALYSIS DIP (MANUAL ENTRY) - Abnormal; Notable for the following components:      Result Value   Blood, UA large (*)    Protein Ur, POC =30 (*)    All other components within normal limits  CERVICOVAGINAL ANCILLARY ONLY    EKG   Radiology No results found.  Procedures Procedures (including critical care time)  Medications Ordered in UC Medications - No data to display  Initial Impression / Assessment and Plan / UC Course  I have reviewed the triage vital signs and the nursing notes.  Pertinent labs & imaging results that were available during my care of  the patient were reviewed by me and considered in my medical decision making (see chart for details).     UA unremarkable.  Will send  cervicovaginal swab to assess if vaginitis is cause of symptoms.  Awaiting results.  Advised strict return precautions.  Patient verbalized understanding and was agreeable with plan. Final Clinical Impressions(s) / UC Diagnoses   Final diagnoses:  Dysuria  Screening examination for venereal disease     Discharge Instructions      Urine was clear.  Vaginal swab is pending.    ED Prescriptions   None    PDMP not reviewed this encounter.   Gustavus Bryant, [place] 12/07/23 [phone]

## 2023-12-07 NOTE — ED Triage Notes (Addendum)
 Pt presents with UTI symptoms that started early Thursday morning. Pt states she has lower abdominal pain but he isn't sure if it is coming from the UTI or if the pain is in fact cramps. Pt is also wanting to be "swabbed" to make sure it isn't anything else.

## 2023-12-10 LAB — CERVICOVAGINAL ANCILLARY ONLY
Bacterial Vaginitis (gardnerella): NEGATIVE
Candida Glabrata: NEGATIVE
Candida Vaginitis: POSITIVE — AB
Chlamydia: NEGATIVE
Comment: NEGATIVE
Comment: NEGATIVE
Comment: NEGATIVE
Comment: NEGATIVE
Comment: NEGATIVE
Comment: NORMAL
Neisseria Gonorrhea: NEGATIVE
Trichomonas: NEGATIVE

## 2023-12-11 ENCOUNTER — Telehealth (HOSPITAL_COMMUNITY): Payer: Self-pay

## 2023-12-11 MED ORDER — FLUCONAZOLE 150 MG PO TABS: 150.0000 mg | ORAL_TABLET | Freq: Once | ORAL | 0 refills | Status: AC

## 2023-12-11 NOTE — Telephone Encounter (Signed)
 Per protocol, pt requires tx with Diflucan.  Rx sent to pharmacy on file.

## 2024-01-08 ENCOUNTER — Ambulatory Visit
Admission: EM | Admit: 2024-01-08 | Discharge: 2024-01-08 | Disposition: A | Discharge status: Home / Self Care | Attending: Family Medicine | Admitting: Family Medicine

## 2024-01-08 DIAGNOSIS — J301 Allergic rhinitis due to pollen: Secondary | ICD-10-CM | POA: Diagnosis not present

## 2024-01-08 DIAGNOSIS — H9201 Otalgia, right ear: Secondary | ICD-10-CM | POA: Diagnosis not present

## 2024-01-08 DIAGNOSIS — H6991 Unspecified Eustachian tube disorder, right ear: Secondary | ICD-10-CM | POA: Diagnosis not present

## 2024-01-08 MED ORDER — FLUTICASONE PROPIONATE 50 MCG/ACT NA SUSP: 2.0000 | Freq: Every day | NASAL | 0 refills | Status: AC

## 2024-01-08 MED ORDER — NAPROXEN 500 MG PO TABS: 500.0000 mg | ORAL_TABLET | Freq: Two times a day (BID) | ORAL | 0 refills | Status: AC | PRN

## 2024-01-08 NOTE — ED Provider Notes (Signed)
 [name]    CSN: [id number] Arrival date & time: 01/08/24  1514      History   Chief Complaint Chief Complaint  Patient presents with   Otalgia    HPI Hannah Weber is a 33 y.o. female.    Otalgia  Here for right ear pain and feeling stopped up.  Symptoms began about 2 days ago.  She has been having some nasal congestion for a week or 2, since the pollen was more pronounced  No cough and no sore throat.  No vomiting or diarrhea or fever.  She did have a right sided migraine headache about 2 days ago and then the right ear pain developed.  NKDA  LMP was April 5 Past Medical History:  Diagnosis Date   Allergy    Hypertension    Smoker unmotivated to quit 09/07/2015    Patient Active Problem List   Diagnosis Date Noted   Cervical lymphadenopathy 02/20/2023   Enlarged lymph node 01/02/2023   Acute recurrent sialoadenitis 12/06/2022   Facial swelling 12/06/2022   Neck pain 12/06/2022   Otalgia, left 03/10/2021   Temporomandibular joint dysfunction 03/10/2021   Tinnitus of both ears 03/10/2021   Hyperprolactinemia (HCC) 06/18/2020   Ketonuria 01/12/2018   Achilles tendinitis of both lower extremities 11/09/2017    History reviewed. No pertinent surgical history.  OB History   No obstetric history on file.      Home Medications    Prior to Admission medications   Medication Sig Start Date End Date Taking? Authorizing Provider  fluticasone (FLONASE) 50 MCG/ACT nasal spray Place 2 sprays into both nostrils daily. 01/08/24  Yes Hannah Resides, MD  naproxen (NAPROSYN) 500 MG tablet Take 1 tablet (500 mg total) by mouth 2 (two) times daily as needed (pain). 01/08/24  Yes Hannah Resides, MD  acetaminophen (TYLENOL) 500 MG tablet Take 500 mg by mouth every 6 (six) hours as needed for moderate pain.    [provider]  clindamycin (CLEOCIN T) 1 % external solution Apply topically.    [provider]  escitalopram (LEXAPRO) 10  MG tablet Take 1 tablet (10 mg total) by mouth daily. 08/22/23   Georganna Skeans, MD  escitalopram (LEXAPRO) 5 MG tablet Take 1 tablet (5 mg total) by mouth daily. 05/17/23   Georganna Skeans, MD  Olopatadine HCl (PATADAY OP) Place 1 drop into both eyes daily as needed (itching).    [provider]  SF 5000 PLUS 1.1 % CREA dental cream SMARTSIG:Sparingly By Mouth Daily 10/03/21   [provider]  tretinoin (RETIN-A) 0.025 % cream Apply topically.    [provider]    Family History Family History  Problem Relation Age of Onset   Diabetes Mother     Social History Social History   Tobacco Use   Smoking status: Former    Current packs/day: 0.00    Average packs/day: 0.1 packs/day for 16.0 years (1.6 ttl pk-yrs)    Types: Cigarettes    Start date: 2006    Quit date: 10/02/2020    Years since quitting: 3.2   Smokeless tobacco: Never  Vaping Use   Vaping status: Never Used  Substance Use Topics   Alcohol use: Yes    Comment: Occassinally.   Drug use: Yes    Types: Marijuana    Comment: 2 times a week     Allergies   Apple and Other   Review of Systems Review of Systems  HENT:  Positive  for ear pain.      Physical Exam Triage Vital Signs ED Triage Vitals  Encounter Vitals Group     BP 01/08/24 1543 110/77     Systolic BP Percentile --      Diastolic BP Percentile --      Pulse Rate 01/08/24 1543 63     Resp 01/08/24 1543 18     Temp 01/08/24 1543 98.1 F (36.7 C)     Temp Source 01/08/24 1543 Oral     SpO2 01/08/24 1543 97 %     Weight 01/08/24 1541 220 lb (99.8 kg)     Height 01/08/24 1541 5\' 8"  (1.727 m)     Head Circumference --      Peak Flow --      Pain Score 01/08/24 1538 4     Pain Loc --      Pain Education --      Exclude from Growth Chart --    No data found.  Updated Vital Signs BP 110/77 (BP Location: Left Arm)   Pulse 63   Temp 98.1 F (36.7 C) (Oral)   Resp 18   Ht 5\' 8"  (1.727 m)   Wt 99.8 kg   LMP 01/05/2024  (Exact Date)   SpO2 97%   BMI 33.45 kg/m   Visual Acuity Right Eye Distance:   Left Eye Distance:   Bilateral Distance:    Right Eye Near:   Left Eye Near:    Bilateral Near:     Physical Exam Vitals reviewed.  Constitutional:      General: She is not in acute distress.    Appearance: She is not toxic-appearing.  HENT:     Right Ear: Tympanic membrane and ear canal normal.     Left Ear: Tympanic membrane and ear canal normal.     Ears:     Comments: There is no pain on traction of either pinna.  The tympanic membranes are bilaterally gray and shiny and the canals are normal.    Nose: Nose normal. No congestion.     Mouth/Throat:     Mouth: Mucous membranes are moist.     Pharynx: No oropharyngeal exudate or posterior oropharyngeal erythema.  Eyes:     Extraocular Movements: Extraocular movements intact.     Conjunctiva/sclera: Conjunctivae normal.     Pupils: Pupils are equal, round, and reactive to light.  Cardiovascular:     Rate and Rhythm: Normal rate and regular rhythm.     Heart sounds: No murmur heard. Pulmonary:     Effort: Pulmonary effort is normal. No respiratory distress.     Breath sounds: No stridor. No wheezing, rhonchi or rales.  Musculoskeletal:     Cervical back: Neck supple.  Lymphadenopathy:     Cervical: No cervical adenopathy.  Skin:    Capillary Refill: Capillary refill takes less than 2 seconds.     Coloration: Skin is not jaundiced or pale.  Neurological:     General: No focal deficit present.     Mental Status: She is alert and oriented to person, place, and time.  Psychiatric:        Behavior: Behavior normal.      UC Treatments / Results  Labs (all labs ordered are listed, but only abnormal results are displayed) Labs Reviewed - No data to display  EKG   Radiology No results found.  Procedures Procedures (including critical care time)  Medications Ordered in UC Medications - No data to display  Initial  Impression /  Assessment and Plan / UC Course  I have reviewed the triage vital signs and the nursing notes.  Pertinent labs & imaging results that were available during my care of the patient were reviewed by me and considered in my medical decision making (see chart for details).     I think this is most likely eustachian tube dysfunction due to allergic rhinitis, causing the right ear pain.  Flonase is sent in to treat the allergies and naproxen is sent in for the pain, as ibuprofen has not helped.  I have also recommended over-the-counter and histamines.  She has an appointment sometime soon with ENT, and will follow-up with her PCP. Final Clinical Impressions(s) / UC Diagnoses   Final diagnoses:  Seasonal allergic rhinitis due to pollen  Dysfunction of right eustachian tube  Right ear pain     Discharge Instructions      Fluticasone/Flonase nose spray--put 2 sprays in each nostril once daily  Take naproxen 500 mg--1 tablet every 12 hours as needed for pain  Get some Allegra/fexofenadine 180 mg--take 1 daily as needed for allergies.  Please follow-up with your primary care, and I am glad you are going to get to see an ENT sometime soon.    ED Prescriptions     Medication Sig Dispense Auth. Provider   fluticasone (FLONASE) 50 MCG/ACT nasal spray Place 2 sprays into both nostrils daily. 16 g Hannah Resides, MD   naproxen (NAPROSYN) 500 MG tablet Take 1 tablet (500 mg total) by mouth 2 (two) times daily as needed (pain). 30 tablet Ayvah Caroll, Janace Aris, MD      PDMP not reviewed this encounter.   Hannah Resides, MD 01/08/24 [phone]

## 2024-01-08 NOTE — Discharge Instructions (Signed)
 Fluticasone/Flonase nose spray--put 2 sprays in each nostril once daily  Take naproxen 500 mg--1 tablet every 12 hours as needed for pain  Get some Allegra/fexofenadine 180 mg--take 1 daily as needed for allergies.  Please follow-up with your primary care, and I am glad you are going to get to see an ENT sometime soon.

## 2024-01-08 NOTE — ED Notes (Signed)
 Pt endorses some nasal congestion. Denies sore throat or cough.

## 2024-01-08 NOTE — ED Triage Notes (Signed)
"  I am having right ear pain, this started yesterday". No drainage from ear "but ringing loud and sound is muffled".

## 2024-02-06 ENCOUNTER — Ambulatory Visit (INDEPENDENT_AMBULATORY_CARE_PROVIDER_SITE_OTHER): Admitting: Family Medicine

## 2024-02-06 VITALS — BP 134/90 | HR 88 | Ht 68.0 in | Wt 238.8 lb

## 2024-02-06 DIAGNOSIS — F32A Depression, unspecified: Secondary | ICD-10-CM

## 2024-02-06 DIAGNOSIS — Z0289 Encounter for other administrative examinations: Secondary | ICD-10-CM

## 2024-02-06 DIAGNOSIS — F419 Anxiety disorder, unspecified: Secondary | ICD-10-CM

## 2024-02-06 MED ORDER — ESCITALOPRAM OXALATE 20 MG PO TABS: 20.0000 mg | ORAL_TABLET | Freq: Every day | ORAL | 1 refills | Status: DC

## 2024-02-06 NOTE — Progress Notes (Unsigned)
 Established Patient Office Visit  Subjective    Patient ID: Hannah Weber, female    DOB: Jul 01, 1991  Age: 33 y.o. MRN: [id number]  CC:  Chief Complaint  Patient presents with   Medication Refill   paperwork     HPI Hannah Weber presents for follow up of anxiety/depression and completion of forms for her employer. She is currently working with a Veterinary surgeon. Kshe would like the dosage of her meds increased.   Outpatient Encounter Medications as of 02/06/2024  Medication Sig   acetaminophen (TYLENOL) 500 MG tablet Take 500 mg by mouth every 6 (six) hours as needed for moderate pain.   clindamycin (CLEOCIN T) 1 % external solution Apply topically.   escitalopram  (LEXAPRO ) 10 MG tablet Take 1 tablet (10 mg total) by mouth daily.   escitalopram  (LEXAPRO ) 20 MG tablet Take 1 tablet (20 mg total) by mouth daily.   fluticasone  (FLONASE ) 50 MCG/ACT nasal spray Place 2 sprays into both nostrils daily.   naproxen  (NAPROSYN ) 500 MG tablet Take 1 tablet (500 mg total) by mouth 2 (two) times daily as needed (pain).   Olopatadine HCl (PATADAY OP) Place 1 drop into both eyes daily as needed (itching).   SF 5000 PLUS 1.1 % CREA dental cream SMARTSIG:Sparingly By Mouth Daily   tretinoin (RETIN-A) 0.025 % cream Apply topically.   [DISCONTINUED] escitalopram  (LEXAPRO ) 5 MG tablet Take 1 tablet (5 mg total) by mouth daily.   No facility-administered encounter medications on file as of 02/06/2024.    Past Medical History:  Diagnosis Date   Allergy    Hypertension    Smoker unmotivated to quit 09/07/2015    No past surgical history on file.  Family History  Problem Relation Age of Onset   Diabetes Mother     Social History   Socioeconomic History   Marital status: Significant Other    Spouse name: Marko Silk   Number of children: Not on file   Years of education: Not on file   Highest education level: Some college, no degree  Occupational History   Occupation: customer service     Employer: [employer]  Tobacco Use   Smoking status: Former    Current packs/day: 0.00    Average packs/day: 0.1 packs/day for 16.0 years (1.6 ttl pk-yrs)    Types: Cigarettes    Start date: 2006    Quit date: 10/02/2020    Years since quitting: 3.3   Smokeless tobacco: Never  Vaping Use   Vaping status: Never Used  Substance and Sexual Activity   Alcohol use: Yes    Comment: Occassinally.   Drug use: Yes    Types: Marijuana    Comment: 2 times a week   Sexual activity: Never    Birth control/protection: None  Other Topics Concern   Not on file  Social History Narrative   Not on file   Social Drivers of Health   Financial Resource Strain: Low Risk  (02/06/2024)   Overall Financial Resource Strain (CARDIA)    Difficulty of Paying Living Expenses: Not very hard  Food Insecurity: No Food Insecurity (02/06/2024)   Hunger Vital Sign    Worried About Running Out of Food in the Last Year: Never true    Ran Out of Food in the Last Year: Never true  Transportation Needs: No Transportation Needs (02/06/2024)   PRAPARE - Administrator, Civil Service (Medical): No    Lack of Transportation (Non-Medical): No  Physical Activity: Insufficiently Active (02/06/2024)  Exercise Vital Sign    Days of Exercise per Week: 2 days    Minutes of Exercise per Session: 20 min  Stress: Stress Concern Present (02/06/2024)   [employer] of Occupational Health - Occupational Stress Questionnaire    Feeling of Stress : Very much  Social Connections: Socially Isolated (02/06/2024)   Social Connection and Isolation Panel [NHANES]    Frequency of Communication with Friends and Family: Three times a week    Frequency of Social Gatherings with Friends and Family: Patient declined    Attends Religious Services: Never    Database administrator or Organizations: No    Attends Engineer, structural: Not on file    Marital Status: Never married  Intimate Partner Violence: Not on file    Review  of Systems  Psychiatric/Behavioral:  Positive for depression. Negative for suicidal ideas. The patient is nervous/anxious.   All other systems reviewed and are negative.       Objective    BP (!) 134/90 (BP Location: Right Arm, Patient Position: Sitting, Cuff Size: Normal)   Pulse 88   Ht 5\' 8"  (1.727 m)   Wt 238 lb 12.8 oz (108.3 kg)   LMP 01/05/2024 (Exact Date)   SpO2 98%   BMI 36.31 kg/m   Physical Exam Vitals and nursing note reviewed.  Constitutional:      General: She is not in acute distress. Cardiovascular:     Rate and Rhythm: Normal rate and regular rhythm.  Pulmonary:     Effort: Pulmonary effort is normal.     Breath sounds: Normal breath sounds.  Abdominal:     Palpations: Abdomen is soft.     Tenderness: There is no abdominal tenderness.  Neurological:     General: No focal deficit present.     Mental Status: She is alert and oriented to person, place, and time.  Psychiatric:        Mood and Affect: Mood is depressed. Affect is blunt.         Assessment & Plan:   Anxiety and depression  Encounter for completion of form with patient  Other orders -     Escitalopram  Oxalate; Take 1 tablet (20 mg total) by mouth daily.  Dispense: 90 tablet; Refill: 1     No follow-ups on file.   Arlo Lama, MD

## 2024-02-07 ENCOUNTER — Encounter: Payer: Self-pay | Admitting: Family Medicine

## 2024-02-26 ENCOUNTER — Ambulatory Visit: Admitting: Family Medicine

## 2024-03-24 ENCOUNTER — Ambulatory Visit: Admitting: Family Medicine

## 2024-04-10 ENCOUNTER — Telehealth: Payer: Self-pay

## 2024-04-10 ENCOUNTER — Ambulatory Visit (INDEPENDENT_AMBULATORY_CARE_PROVIDER_SITE_OTHER): Admitting: Family Medicine

## 2024-04-10 VITALS — BP 139/91 | HR 99 | Resp 95 | Ht 68.0 in | Wt 242.6 lb

## 2024-04-10 DIAGNOSIS — F419 Anxiety disorder, unspecified: Secondary | ICD-10-CM

## 2024-04-10 DIAGNOSIS — Z0289 Encounter for other administrative examinations: Secondary | ICD-10-CM

## 2024-04-10 DIAGNOSIS — F32A Depression, unspecified: Secondary | ICD-10-CM

## 2024-04-10 MED ORDER — BUPROPION HCL ER (XL) 150 MG PO TB24: 150.0000 mg | ORAL_TABLET | Freq: Every day | ORAL | 0 refills | Status: DC

## 2024-04-10 NOTE — Telephone Encounter (Signed)
 Faxed job accomodation form to [employer] at [phone]

## 2024-04-10 NOTE — Progress Notes (Signed)
 Established Patient Office Visit  Subjective    Patient ID: Hannah Weber, female    DOB: [birth year]  Age: 33 y.o. MRN: [id number]  CC:  Chief Complaint  Patient presents with   Medical Management of Chronic Issues    Pt needs forms filled out. Forms in room with patient.  Patient requesting higher dose on medication     HPI Hannah Weber presents for completion of forms for anxiety/depression. She reports increased sx since her girlfriend has been diagnosed recently with breast cancer.   Outpatient Encounter Medications as of 04/10/2024  Medication Sig   acetaminophen (TYLENOL) 500 MG tablet Take 500 mg by mouth every 6 (six) hours as needed for moderate pain.   buPROPion  (WELLBUTRIN  XL) 150 MG 24 hr tablet Take 1 tablet (150 mg total) by mouth daily.   clindamycin (CLEOCIN T) 1 % external solution Apply topically.   escitalopram  (LEXAPRO ) 10 MG tablet Take 1 tablet (10 mg total) by mouth daily.   escitalopram  (LEXAPRO ) 20 MG tablet Take 1 tablet (20 mg total) by mouth daily.   fluticasone  (FLONASE ) 50 MCG/ACT nasal spray Place 2 sprays into both nostrils daily.   naproxen  (NAPROSYN ) 500 MG tablet Take 1 tablet (500 mg total) by mouth 2 (two) times daily as needed (pain).   Olopatadine HCl (PATADAY OP) Place 1 drop into both eyes daily as needed (itching).   SF 5000 PLUS 1.1 % CREA dental cream SMARTSIG:Sparingly By Mouth Daily   tretinoin (RETIN-A) 0.025 % cream Apply topically.   No facility-administered encounter medications on file as of 04/10/2024.    Past Medical History:  Diagnosis Date   Allergy    Hypertension    Smoker unmotivated to quit 09/07/2015    History reviewed. No pertinent surgical history.  Family History  Problem Relation Age of Onset   Diabetes Mother     Social History   Socioeconomic History   Marital status: Significant Other    Spouse name: Sheppard   Number of children: Not on file   Years of education: Not on file   Highest  education level: Associate degree: academic program  Occupational History   Occupation: customer service    Employer: [employer]  Tobacco Use   Smoking status: Former    Current packs/day: 0.00    Average packs/day: 0.1 packs/day for 16.0 years (1.6 ttl pk-yrs)    Types: Cigarettes    Start date: 2006    Quit date: 10/02/2020    Years since quitting: 3.5   Smokeless tobacco: Never  Vaping Use   Vaping status: Never Used  Substance and Sexual Activity   Alcohol use: Yes    Comment: Occassinally.   Drug use: Yes    Types: Marijuana    Comment: 2 times a week   Sexual activity: Never    Birth control/protection: None  Other Topics Concern   Not on file  Social History Narrative   Not on file   Social Drivers of Health   Financial Resource Strain: Low Risk  (04/10/2024)   Overall Financial Resource Strain (CARDIA)    Difficulty of Paying Living Expenses: Not very hard  Food Insecurity: No Food Insecurity (04/10/2024)   Hunger Vital Sign    Worried About Running Out of Food in the Last Year: Never true    Ran Out of Food in the Last Year: Never true  Transportation Needs: No Transportation Needs (04/10/2024)   PRAPARE - Administrator, Civil Service (Medical): No  Lack of Transportation (Non-Medical): No  Physical Activity: Insufficiently Active (04/10/2024)   Exercise Vital Sign    Days of Exercise per Week: 3 days    Minutes of Exercise per Session: 20 min  Stress: Stress Concern Present (04/10/2024)   [employer] of Occupational Health - Occupational Stress Questionnaire    Feeling of Stress: Rather much  Social Connections: Socially Isolated (04/10/2024)   Social Connection and Isolation Panel    Frequency of Communication with Friends and Family: Once a week    Frequency of Social Gatherings with Friends and Family: Once a week    Attends Religious Services: Never    Database administrator or Organizations: No    Attends Engineer, structural: Not  on file    Marital Status: Living with partner  Intimate Partner Violence: Not on file    Review of Systems  All other systems reviewed and are negative.       Objective    BP (!) 139/91   Pulse 99   Resp (!) 95   Ht 5' 8 (1.727 m)   Wt 242 lb 9.6 oz (110 kg)   LMP 04/07/2024 (Approximate)   BMI 36.89 kg/m   Physical Exam Vitals and nursing note reviewed.  Constitutional:      General: She is not in acute distress. Cardiovascular:     Rate and Rhythm: Normal rate and regular rhythm.  Pulmonary:     Effort: Pulmonary effort is normal.     Breath sounds: Normal breath sounds.  Abdominal:     Palpations: Abdomen is soft.     Tenderness: There is no abdominal tenderness.  Neurological:     General: No focal deficit present.     Mental Status: She is alert and oriented to person, place, and time.  Psychiatric:        Mood and Affect: Mood is depressed. Affect is blunt.         Assessment & Plan:   1. Anxiety and depression (Primary) Will add wellbutrin  150 mg to regimen  2. Encounter for completion of form with patient Forms completed and days /mo increased from 3 to 4 days /mo    Return in about 6 weeks (around 05/22/2024).   Tanda Raguel SQUIBB, MD

## 2024-04-18 ENCOUNTER — Encounter: Payer: Self-pay | Admitting: Family Medicine

## 2024-04-18 NOTE — Telephone Encounter (Signed)
 The patient called in upset as she has an accomodation form she was told was faxed to her job. She has been told by her job they have not received it yet. I spoke with [place] and she had me transfer the patient to her for further assistance.

## 2024-04-18 NOTE — Telephone Encounter (Signed)
 Patient called back requesting to speak Hannah Weber. Patient requesting confirmation number for fax. Patient requesting to speak to someone who has access to fax machine. Transferred patient to [facility] to further assist.

## 2024-04-21 ENCOUNTER — Encounter: Payer: Self-pay | Admitting: Family Medicine

## 2024-05-08 ENCOUNTER — Ambulatory Visit: Admitting: Family Medicine

## 2024-06-11 DIAGNOSIS — M546 Pain in thoracic spine: Secondary | ICD-10-CM | POA: Diagnosis not present

## 2024-06-14 ENCOUNTER — Emergency Department (HOSPITAL_COMMUNITY)
Admission: EM | Admit: 2024-06-14 | Discharge: 2024-06-14 | Disposition: A | Discharge status: Home / Self Care | Attending: Emergency Medicine | Admitting: Emergency Medicine

## 2024-06-14 ENCOUNTER — Other Ambulatory Visit: Payer: Self-pay

## 2024-06-14 ENCOUNTER — Emergency Department (HOSPITAL_COMMUNITY)

## 2024-06-14 ENCOUNTER — Encounter (HOSPITAL_COMMUNITY): Payer: Self-pay

## 2024-06-14 DIAGNOSIS — R079 Chest pain, unspecified: Secondary | ICD-10-CM | POA: Diagnosis present

## 2024-06-14 DIAGNOSIS — M940 Chondrocostal junction syndrome [Tietze]: Secondary | ICD-10-CM | POA: Diagnosis not present

## 2024-06-14 DIAGNOSIS — R0789 Other chest pain: Secondary | ICD-10-CM | POA: Diagnosis not present

## 2024-06-14 LAB — I-STAT CHEM 8, ED
BUN: 10 mg/dL (ref 6–20)
Calcium, Ion: 1.21 mmol/L (ref 1.15–1.40)
Chloride: 104 mmol/L (ref 98–111)
Creatinine, Ser: 0.9 mg/dL (ref 0.44–1.00)
Glucose, Bld: 97 mg/dL (ref 70–99)
HCT: 43 % (ref 36.0–46.0)
Hemoglobin: 14.6 g/dL (ref 12.0–15.0)
Potassium: 3.4 mmol/L — ABNORMAL LOW (ref 3.5–5.1)
Sodium: 140 mmol/L (ref 135–145)
TCO2: 22 mmol/L (ref 22–32)

## 2024-06-14 LAB — BASIC METABOLIC PANEL WITH GFR
Anion gap: 13 (ref 5–15)
BUN: 10 mg/dL (ref 6–20)
CO2: 20 mmol/L — ABNORMAL LOW (ref 22–32)
Calcium: 9.3 mg/dL (ref 8.9–10.3)
Chloride: 103 mmol/L (ref 98–111)
Creatinine, Ser: 0.94 mg/dL (ref 0.44–1.00)
GFR, Estimated: >60 mL/min (ref >60)
Glucose, Bld: 96 mg/dL (ref 70–99)
Potassium: 3.5 mmol/L (ref 3.5–5.1)
Sodium: 136 mmol/L (ref 135–145)

## 2024-06-14 LAB — CBC
HCT: 39.5 % (ref 36.0–46.0)
Hemoglobin: 13.6 g/dL (ref 12.0–15.0)
MCH: 28.6 pg (ref 26.0–34.0)
MCHC: 34.4 g/dL (ref 30.0–36.0)
MCV: 83.2 fL (ref 80.0–100.0)
Platelets: 311 K/uL (ref 150–400)
RBC: 4.75 MIL/uL (ref 3.87–5.11)
RDW: 14.3 % (ref 11.5–15.5)
WBC: 12.9 K/uL — ABNORMAL HIGH (ref 4.0–10.5)
nRBC: 0 % (ref 0.0–0.2)

## 2024-06-14 LAB — HCG, SERUM, QUALITATIVE: Preg, Serum: NEGATIVE

## 2024-06-14 LAB — TROPONIN I (HIGH SENSITIVITY)
Troponin I (High Sensitivity): 3 ng/L (ref <18)
Troponin I (High Sensitivity): 3 ng/L (ref <18)

## 2024-06-14 MED ORDER — IBUPROFEN 400 MG PO TABS
600.0000 mg | ORAL_TABLET | Freq: Once | ORAL | Status: AC
  Administered 2024-06-14: 600 mg via ORAL
  Filled 2024-06-14: qty 1

## 2024-06-14 NOTE — Discharge Instructions (Addendum)
 Evaluation today revealed that you likely do have costochondritis.  As we discussed treatment is supportive at home and you can take ibuprofen  as needed.  Please follow-up with your PCP.  If your chest pain worsens, you have shortness of breath, calf tenderness or swelling or any other concerning symptom please return to the ED for further evaluation.

## 2024-06-14 NOTE — ED Triage Notes (Signed)
 Pt states that she has been having L sided CP, radiation to L arm. Denies any other cardiac symptoms

## 2024-06-14 NOTE — ED Provider Notes (Signed)
 [place] EMERGENCY DEPARTMENT AT [facility] Provider Note   CSN: [id number] Arrival date & time: 06/14/24  0240     Patient presents with: Chest Pain  HPI Hannah Weber is a 33 y.o. female with HTN presenting for chest pain.  Started 3 days ago.  It is in the center of her chest and at times radiates to the left shoulder.  Denies shortness of breath or diaphoresis. She states overall is better.  It is worse with movement.  Nonexertional.  She states she has had similar pain like this a couple years ago and was diagnosed with costochondritis.     Chest Pain      Prior to Admission medications   Medication Sig Start Date End Date Taking? Authorizing Provider  acetaminophen (TYLENOL) 500 MG tablet Take 500 mg by mouth every 6 (six) hours as needed for moderate pain.    [provider]  buPROPion  (WELLBUTRIN  XL) 150 MG 24 hr tablet Take 1 tablet (150 mg total) by mouth daily. 04/10/24   Tanda Bleacher, MD  clindamycin (CLEOCIN T) 1 % external solution Apply topically.    [provider]  escitalopram  (LEXAPRO ) 10 MG tablet Take 1 tablet (10 mg total) by mouth daily. 08/22/23   Tanda Bleacher, MD  escitalopram  (LEXAPRO ) 20 MG tablet Take 1 tablet (20 mg total) by mouth daily. 02/06/24   Tanda Bleacher, MD  fluticasone  (FLONASE ) 50 MCG/ACT nasal spray Place 2 sprays into both nostrils daily. 01/08/24   Vonna Sharlet POUR, MD  naproxen  (NAPROSYN ) 500 MG tablet Take 1 tablet (500 mg total) by mouth 2 (two) times daily as needed (pain). 01/08/24   Banister, Pamela K, MD  Olopatadine HCl (PATADAY OP) Place 1 drop into both eyes daily as needed (itching).    [provider]  SF 5000 PLUS 1.1 % CREA dental cream SMARTSIG:Sparingly By Mouth Daily 10/03/21   [provider]  tretinoin (RETIN-A) 0.025 % cream Apply topically.    [provider]    Allergies: Apple and Other    Review of Systems  Cardiovascular:  Positive for chest pain.     Updated Vital Signs BP (!) 123/91 (BP Location: Right Arm)   Pulse 80   Temp 98.4 F (36.9 C) (Oral)   Resp 12   LMP 06/04/2024   SpO2 100%   Physical Exam Vitals and nursing note reviewed. Exam conducted with a chaperone present.  HENT:     Head: Normocephalic and atraumatic.     Mouth/Throat:     Mouth: Mucous membranes are moist.  Eyes:     General:        Right eye: No discharge.        Left eye: No discharge.     Conjunctiva/sclera: Conjunctivae normal.  Cardiovascular:     Rate and Rhythm: Normal rate and regular rhythm.     Pulses: Normal pulses.     Heart sounds: Normal heart sounds.  Pulmonary:     Effort: Pulmonary effort is normal.     Breath sounds: Normal breath sounds.  Chest:    Abdominal:     General: Abdomen is flat.     Palpations: Abdomen is soft.  Skin:    General: Skin is warm and dry.  Neurological:     General: No focal deficit present.  Psychiatric:        Mood and Affect: Mood normal.     (all labs ordered are listed, but only abnormal results are displayed)  Labs Reviewed  BASIC METABOLIC PANEL WITH GFR - Abnormal; Notable for the following components:      Result Value   CO2 20 (*)    All other components within normal limits  CBC - Abnormal; Notable for the following components:   WBC 12.9 (*)    All other components within normal limits  I-STAT CHEM 8, ED - Abnormal; Notable for the following components:   Potassium 3.4 (*)    All other components within normal limits  HCG, SERUM, QUALITATIVE  TROPONIN I (HIGH SENSITIVITY)  TROPONIN I (HIGH SENSITIVITY)    EKG: EKG Interpretation Date/Time:  Saturday June 14 2024 03:03:33 EDT Ventricular Rate:  105 PR Interval:  142 QRS Duration:  78 QT Interval:  326 QTC Calculation: 430 R Axis:   4  Text Interpretation: Sinus tachycardia When compared with ECG of 20-Jul-2020 18:31, PREVIOUS ECG IS PRESENT Confirmed by Cottie Cough [phone]) on 06/14/2024 7:06:16  AM  Radiology: [facility] Chest 2 View Result Date: 06/14/2024 CLINICAL DATA:  Chest pain EXAM: CHEST - 2 VIEW COMPARISON:  04/18/2021 FINDINGS: The heart size and mediastinal contours are within normal limits. Both lungs are clear. The visualized skeletal structures are unremarkable. IMPRESSION: No active cardiopulmonary disease. Electronically Signed   By: Oneil Devonshire M.D.   On: 06/14/2024 03:46     Procedures   Medications Ordered in the ED  ibuprofen  (ADVIL ) tablet 600 mg (has no administration in time range)                                    Medical Decision Making Amount and/or Complexity of Data Reviewed Labs: ordered. Radiology: ordered.   33 year old well-appearing female presenting for chest pain. Exam notable for chest wall tenderness.  DDx includes ACS, PE, pneumothorax, pneumonia, aortic dissection, reflux, costochondritis, other.  Doubt ACS given reassuring EKG, negative serial troponins and lack of risk factors aside from hypertension.  Also considered PE but unlikely given nonpleuritic chest pain, no evidence of right heart strain and lack of symptoms and risk factors aside from intermittent tachycardia while here in the ED. Patient has reproducible chest wall tenderness.  Also worse with movement.  This is likely costochondritis.  Treated with ibuprofen  and advised supportive treatment with NSAIDs at home and follow-up with PCP.  Discussed return precautions.  Discharged good condition.     Final diagnoses:  Chest wall tenderness    ED Discharge Orders     None          Lang Norleen POUR, PA-C 06/14/24 [id number]    Bari Charmaine FALCON, MD 06/14/24 2330

## 2024-06-14 NOTE — ED Triage Notes (Signed)
 Pt arrive POV c/o left side cp radiating to her left arm. Denies any SOB, dizziness, nausea or vomiting. Pt reports having this cp for the past 3 days.

## 2024-06-15 ENCOUNTER — Inpatient Hospital Stay: Admission: RE | Admit: 2024-06-15 | Payer: Self-pay | Source: Ambulatory Visit

## 2024-08-12 ENCOUNTER — Ambulatory Visit (INDEPENDENT_AMBULATORY_CARE_PROVIDER_SITE_OTHER): Admitting: Family Medicine

## 2024-08-12 VITALS — BP 143/87 | HR 100 | Ht 68.0 in | Wt 242.4 lb

## 2024-08-12 DIAGNOSIS — F32A Depression, unspecified: Secondary | ICD-10-CM

## 2024-08-12 DIAGNOSIS — Z0289 Encounter for other administrative examinations: Secondary | ICD-10-CM | POA: Diagnosis not present

## 2024-08-12 DIAGNOSIS — F419 Anxiety disorder, unspecified: Secondary | ICD-10-CM

## 2024-08-12 MED ORDER — ESCITALOPRAM OXALATE 20 MG PO TABS: 20.0000 mg | ORAL_TABLET | Freq: Every day | ORAL | 1 refills | Status: AC

## 2024-08-13 ENCOUNTER — Encounter: Payer: Self-pay | Admitting: Family Medicine

## 2024-08-13 NOTE — Progress Notes (Signed)
 Established Patient Office Visit  Subjective    Patient ID: Hannah Weber, female    DOB: 1991/01/29  Age: 33 y.o. MRN: [id number]  CC:  Chief Complaint  Patient presents with   Medical Management of Chronic Issues    Pt has ADA paperwork for Dr. Tanda to fill out.     HPI [name] presents for completion for forms for employment regarding anxiety/depression. Patient reports that she is doing well with present management.   Outpatient Encounter Medications as of 08/12/2024  Medication Sig   acetaminophen (TYLENOL) 500 MG tablet Take 500 mg by mouth every 6 (six) hours as needed for moderate pain.   fluticasone  (FLONASE ) 50 MCG/ACT nasal spray Place 2 sprays into both nostrils daily.   naproxen  (NAPROSYN ) 500 MG tablet Take 1 tablet (500 mg total) by mouth 2 (two) times daily as needed (pain).   [DISCONTINUED] escitalopram  (LEXAPRO ) 20 MG tablet Take 1 tablet (20 mg total) by mouth daily.   buPROPion  (WELLBUTRIN  XL) 150 MG 24 hr tablet Take 1 tablet (150 mg total) by mouth daily. (Patient not taking: Reported on 08/12/2024)   clindamycin (CLEOCIN T) 1 % external solution Apply topically.   escitalopram  (LEXAPRO ) 10 MG tablet Take 1 tablet (10 mg total) by mouth daily. (Patient not taking: Reported on 08/12/2024)   escitalopram  (LEXAPRO ) 20 MG tablet Take 1 tablet (20 mg total) by mouth daily.   Olopatadine HCl (PATADAY OP) Place 1 drop into both eyes daily as needed (itching).   SF 5000 PLUS 1.1 % CREA dental cream SMARTSIG:Sparingly By Mouth Daily   tretinoin (RETIN-A) 0.025 % cream Apply topically.   No facility-administered encounter medications on file as of 08/12/2024.    Past Medical History:  Diagnosis Date   Allergy    Hypertension    Smoker unmotivated to quit 09/07/2015    History reviewed. No pertinent surgical history.  Family History  Problem Relation Age of Onset   Diabetes Mother     Social History   Socioeconomic History   Marital status:  Significant Other    Spouse name: Sheppard   Number of children: Not on file   Years of education: Not on file   Highest education level: Some college, no degree  Occupational History   Occupation: customer service    Employer: [employer]  Tobacco Use   Smoking status: Former    Current packs/day: 0.00    Average packs/day: 0.1 packs/day for 16.0 years (1.6 ttl pk-yrs)    Types: Cigarettes    Start date: 2006    Quit date: 10/02/2020    Years since quitting: 3.8   Smokeless tobacco: Never  Vaping Use   Vaping status: Never Used  Substance and Sexual Activity   Alcohol use: Yes    Comment: Occassinally.   Drug use: Yes    Types: Marijuana    Comment: 2 times a week   Sexual activity: Never    Birth control/protection: None  Other Topics Concern   Not on file  Social History Narrative   Not on file   Social Drivers of Health   Financial Resource Strain: Low Risk  (08/12/2024)   Overall Financial Resource Strain (CARDIA)    Difficulty of Paying Living Expenses: Not hard at all  Food Insecurity: No Food Insecurity (08/12/2024)   Hunger Vital Sign    Worried About Running Out of Food in the Last Year: Never true    Ran Out of Food in the Last Year: Never  true  Transportation Needs: No Transportation Needs (08/12/2024)   PRAPARE - Administrator, Civil Service (Medical): No    Lack of Transportation (Non-Medical): No  Physical Activity: Insufficiently Active (08/12/2024)   Exercise Vital Sign    Days of Exercise per Week: 2 days    Minutes of Exercise per Session: 20 min  Stress: Stress Concern Present (08/12/2024)   [employer] of Occupational Health - Occupational Stress Questionnaire    Feeling of Stress: To some extent  Social Connections: Unknown (08/12/2024)   Social Connection and Isolation Panel    Frequency of Communication with Friends and Family: More than three times a week    Frequency of Social Gatherings with Friends and Family: Once a week     Attends Religious Services: Not on Marketing Executive or Organizations: No    Attends Banker Meetings: Not on file    Marital Status: Living with partner  Intimate Partner Violence: Not At Risk (08/12/2024)   Humiliation, Afraid, Rape, and Kick questionnaire    Fear of Current or Ex-Partner: No    Emotionally Abused: No    Physically Abused: No    Sexually Abused: No    Review of Systems  All other systems reviewed and are negative.       Objective    BP (!) 143/87   Pulse 100   Ht 5' 8 (1.727 m)   Wt 242 lb 6.4 oz (110 kg)   LMP 08/05/2024 (Exact Date)   SpO2 97%   BMI 36.86 kg/m   Physical Exam Vitals and nursing note reviewed.  Constitutional:      General: She is not in acute distress. Cardiovascular:     Rate and Rhythm: Normal rate and regular rhythm.  Pulmonary:     Effort: Pulmonary effort is normal.     Breath sounds: Normal breath sounds.  Abdominal:     Palpations: Abdomen is soft.     Tenderness: There is no abdominal tenderness.  Neurological:     General: No focal deficit present.     Mental Status: She is alert and oriented to person, place, and time.  Psychiatric:        Mood and Affect: Mood is depressed. Affect is blunt.         Assessment & Plan:   Anxiety and depression  Encounter for completion of form with patient  Other orders -     Escitalopram  Oxalate; Take 1 tablet (20 mg total) by mouth daily.  Dispense: 90 tablet; Refill: 1     Return in about 6 months (around 02/09/2025) for follow up, chronic med issues.   Tanda Raguel SQUIBB, MD

## 2024-08-15 ENCOUNTER — Telehealth: Payer: Self-pay | Admitting: Family Medicine

## 2024-08-15 NOTE — Telephone Encounter (Signed)
 A document form from [employer] has been faxed: clarification needed for decision , to be filled out by provider. Send document back via Fax within 7-days. Document is located in providers tray at front office.          Fax number: [phone]

## 2024-08-15 NOTE — Telephone Encounter (Signed)
 Received. Given to Dr. Tanda for review and signature

## 2024-08-25 ENCOUNTER — Ambulatory Visit: Admitting: Family Medicine

## 2024-09-11 ENCOUNTER — Ambulatory Visit: Admitting: Family Medicine

## 2024-10-06 ENCOUNTER — Encounter: Admitting: Family

## 2024-10-06 NOTE — Progress Notes (Signed)
 Erroneous encounter-disregard

## 2024-10-10 ENCOUNTER — Encounter: Admitting: Family

## 2024-10-10 NOTE — Progress Notes (Signed)
 Erroneous encounter-disregard

## 2024-10-15 ENCOUNTER — Ambulatory Visit: Admitting: Family

## 2024-10-28 ENCOUNTER — Ambulatory Visit: Admitting: Family Medicine

## 2024-11-04 ENCOUNTER — Ambulatory Visit: Admitting: Family Medicine

## 2024-11-04 ENCOUNTER — Encounter: Payer: Self-pay | Admitting: Family Medicine

## 2024-11-04 VITALS — BP 126/80 | HR 91 | Ht 68.0 in | Wt 247.2 lb

## 2024-11-04 DIAGNOSIS — F32A Depression, unspecified: Secondary | ICD-10-CM

## 2024-11-04 DIAGNOSIS — Z0289 Encounter for other administrative examinations: Secondary | ICD-10-CM | POA: Diagnosis not present

## 2024-11-04 DIAGNOSIS — F418 Other specified anxiety disorders: Secondary | ICD-10-CM | POA: Diagnosis not present

## 2024-11-05 ENCOUNTER — Encounter: Payer: Self-pay | Admitting: Family Medicine

## 2024-11-05 NOTE — Progress Notes (Signed)
 "  Established Patient Office Visit  Subjective    Patient ID: Hannah Weber, female    DOB: [birth year]  Age: 34 y.o. MRN: [id number]  CC:  Chief Complaint  Patient presents with   Medical Management of Chronic Issues    Pt has paperwork for ADA accommodations     HPI HADAS JESSOP presents for follow up of anxiety depression and for completion of forms for accommodations at work. Patient reports that she continue to see counselor and take meds. She denies acute complaints although she says that she would like an increase in the number of days as she has been having panic attacks and increased symtroms since her partner has been diagnosed with cancer.   Outpatient Encounter Medications as of 11/04/2024  Medication Sig   acetaminophen (TYLENOL) 500 MG tablet Take 500 mg by mouth every 6 (six) hours as needed for moderate pain.   clindamycin (CLEOCIN T) 1 % external solution Apply topically.   escitalopram  (LEXAPRO ) 20 MG tablet Take 1 tablet (20 mg total) by mouth daily.   naproxen  (NAPROSYN ) 500 MG tablet Take 1 tablet (500 mg total) by mouth 2 (two) times daily as needed (pain).   Olopatadine HCl (PATADAY OP) Place 1 drop into both eyes daily as needed (itching).   fluticasone  (FLONASE ) 50 MCG/ACT nasal spray Place 2 sprays into both nostrils daily. (Patient not taking: Reported on 11/04/2024)   SF 5000 PLUS 1.1 % CREA dental cream SMARTSIG:Sparingly By Mouth Daily (Patient not taking: Reported on 11/04/2024)   tretinoin (RETIN-A) 0.025 % cream Apply topically. (Patient not taking: Reported on 11/04/2024)   [DISCONTINUED] buPROPion  (WELLBUTRIN  XL) 150 MG 24 hr tablet Take 1 tablet (150 mg total) by mouth daily. (Patient not taking: Reported on 08/12/2024)   [DISCONTINUED] escitalopram  (LEXAPRO ) 10 MG tablet Take 1 tablet (10 mg total) by mouth daily. (Patient not taking: Reported on 08/12/2024)   No facility-administered encounter medications on file as of 11/04/2024.    Past Medical  History:  Diagnosis Date   Allergy    Hypertension    Smoker unmotivated to quit 09/07/2015    History reviewed. No pertinent surgical history.  Family History  Problem Relation Age of Onset   Diabetes Mother     Social History   Socioeconomic History   Marital status: Significant Other    Spouse name: Sheppard   Number of children: Not on file   Years of education: Not on file   Highest education level: Some college, no degree  Occupational History   Occupation: customer service    Employer: [employer]  Tobacco Use   Smoking status: Former    Current packs/day: 0.00    Average packs/day: 0.1 packs/day for 16.0 years (1.6 ttl pk-yrs)    Types: Cigarettes    Start date: 2006    Quit date: 10/02/2020    Years since quitting: 4.0   Smokeless tobacco: Never  Vaping Use   Vaping status: Never Used  Substance and Sexual Activity   Alcohol use: Yes    Comment: Occassinally.   Drug use: Yes    Types: Marijuana    Comment: 2 times a week   Sexual activity: Never    Birth control/protection: None  Other Topics Concern   Not on file  Social History Narrative   Not on file   Social Drivers of Health   Tobacco Use: Medium Risk (11/04/2024)   Patient History    Smoking Tobacco Use: Former    Smokeless Tobacco  Use: Never    Passive Exposure: Not on file  Financial Resource Strain: Low Risk (08/12/2024)   Overall Financial Resource Strain (CARDIA)    Difficulty of Paying Living Expenses: Not hard at all  Food Insecurity: No Food Insecurity (08/12/2024)   Epic    Worried About Programme Researcher, Broadcasting/film/video in the Last Year: Never true    Ran Out of Food in the Last Year: Never true  Transportation Needs: No Transportation Needs (08/12/2024)   Epic    Lack of Transportation (Medical): No    Lack of Transportation (Non-Medical): No  Physical Activity: Insufficiently Active (08/12/2024)   Exercise Vital Sign    Days of Exercise per Week: 2 days    Minutes of Exercise per Session: 20 min   Stress: Stress Concern Present (08/12/2024)   [employer] of Occupational Health - Occupational Stress Questionnaire    Feeling of Stress: To some extent  Social Connections: Unknown (08/12/2024)   Social Connection and Isolation Panel    Frequency of Communication with Friends and Family: More than three times a week    Frequency of Social Gatherings with Friends and Family: Once a week    Attends Religious Services: Not on Insurance Claims Handler of Clubs or Organizations: No    Attends Banker Meetings: Not on file    Marital Status: Living with partner  Intimate Partner Violence: Not At Risk (08/12/2024)   Epic    Fear of Current or Ex-Partner: No    Emotionally Abused: No    Physically Abused: No    Sexually Abused: No  Depression (PHQ2-9): High Risk (02/06/2024)   Depression (PHQ2-9)    PHQ-2 Score: 14  Alcohol Screen: Low Risk (08/12/2024)   Alcohol Screen    Last Alcohol Screening Score (AUDIT): 1  Housing: Low Risk (08/12/2024)   Epic    Unable to Pay for Housing in the Last Year: No    Number of Times Moved in the Last Year: 0    Homeless in the Last Year: No  Utilities: Not At Risk (08/12/2024)   Epic    Threatened with loss of utilities: No  Health Literacy: Adequate Health Literacy (08/12/2024)   B1300 Health Literacy    Frequency of need for help with medical instructions: Never    Review of Systems  Psychiatric/Behavioral:  Positive for depression. The patient is nervous/anxious.   All other systems reviewed and are negative.       Objective    BP 126/80   Pulse 91   Ht 5' 8 (1.727 m)   Wt 247 lb 3.2 oz (112.1 kg)   LMP 10/06/2024 (Exact Date)   SpO2 96%   BMI 37.59 kg/m   Physical Exam Vitals and nursing note reviewed.  Constitutional:      General: She is not in acute distress. Cardiovascular:     Rate and Rhythm: Normal rate and regular rhythm.  Pulmonary:     Effort: Pulmonary effort is normal.     Breath sounds:  Normal breath sounds.  Abdominal:     Palpations: Abdomen is soft.     Tenderness: There is no abdominal tenderness.  Neurological:     General: No focal deficit present.     Mental Status: She is alert and oriented to person, place, and time.  Psychiatric:        Mood and Affect: Mood is depressed. Affect is blunt.         Assessment & Plan:  1. Anxiety and depression (Primary) Referral to psych for med management as patient does not seem to be improving with present management.  - Ambulatory referral to Psychiatry  2. Encounter for completion of form with patient Discussed at length with patient that I will not be completing any additional forms unless I speak directly with her counselor as I am not thinking that her situation/symptoms seems to be improving with present management.      No follow-ups on file.   Tanda Raguel SQUIBB, MD  "

## 2024-12-15 ENCOUNTER — Ambulatory Visit (HOSPITAL_BASED_OUTPATIENT_CLINIC_OR_DEPARTMENT_OTHER): Admitting: Family Medicine

## 2025-02-09 ENCOUNTER — Ambulatory Visit: Admitting: Family Medicine
